# Patient Record
Sex: Female | Born: 1970 | Race: White | Hispanic: No | Marital: Married | Smoking: Never smoker
Health system: Southern US, Community
[De-identification: ages and names within clinical notes are randomized; demographics above are authoritative.]

## PROBLEM LIST (undated history)

## (undated) DIAGNOSIS — Z800 Family history of malignant neoplasm of digestive organs: Secondary | ICD-10-CM

## (undated) DIAGNOSIS — T7840XA Allergy, unspecified, initial encounter: Secondary | ICD-10-CM

## (undated) DIAGNOSIS — K635 Polyp of colon: Secondary | ICD-10-CM

## (undated) DIAGNOSIS — J45909 Unspecified asthma, uncomplicated: Secondary | ICD-10-CM

## (undated) DIAGNOSIS — Z803 Family history of malignant neoplasm of breast: Secondary | ICD-10-CM

## (undated) HISTORY — DX: Family history of malignant neoplasm of breast: Z80.3

## (undated) HISTORY — PX: WISDOM TOOTH EXTRACTION: SHX21

## (undated) HISTORY — DX: Unspecified asthma, uncomplicated: J45.909

## (undated) HISTORY — DX: Allergy, unspecified, initial encounter: T78.40XA

## (undated) HISTORY — DX: Polyp of colon: K63.5

## (undated) HISTORY — DX: Family history of malignant neoplasm of digestive organs: Z80.0

---

## 1998-09-27 HISTORY — PX: WISDOM TOOTH EXTRACTION: SHX21

## 2001-04-17 ENCOUNTER — Encounter: Admission: RE | Admit: 2001-04-17 | Discharge: 2001-04-26 | Payer: Self-pay | Admitting: Orthopedic Surgery

## 2001-04-27 ENCOUNTER — Encounter: Admission: RE | Admit: 2001-04-27 | Discharge: 2001-07-26 | Payer: Self-pay | Admitting: Orthopedic Surgery

## 2004-01-31 ENCOUNTER — Other Ambulatory Visit: Admission: RE | Admit: 2004-01-31 | Discharge: 2004-01-31 | Payer: Self-pay | Admitting: *Deleted

## 2004-01-31 ENCOUNTER — Other Ambulatory Visit: Admission: RE | Admit: 2004-01-31 | Discharge: 2004-01-31 | Payer: Self-pay | Admitting: Obstetrics and Gynecology

## 2006-07-01 ENCOUNTER — Inpatient Hospital Stay (HOSPITAL_COMMUNITY): Admission: AD | Admit: 2006-07-01 | Discharge: 2006-07-04 | Payer: Self-pay | Admitting: Obstetrics and Gynecology

## 2008-02-22 ENCOUNTER — Ambulatory Visit: Payer: Self-pay | Admitting: Emergency Medicine

## 2008-02-22 DIAGNOSIS — J45909 Unspecified asthma, uncomplicated: Secondary | ICD-10-CM | POA: Insufficient documentation

## 2008-02-22 DIAGNOSIS — R0602 Shortness of breath: Secondary | ICD-10-CM | POA: Insufficient documentation

## 2008-05-08 ENCOUNTER — Inpatient Hospital Stay (HOSPITAL_COMMUNITY): Admission: AD | Admit: 2008-05-08 | Discharge: 2008-05-10 | Payer: Self-pay | Admitting: Obstetrics and Gynecology

## 2010-12-28 ENCOUNTER — Other Ambulatory Visit (HOSPITAL_COMMUNITY): Payer: Self-pay | Admitting: Obstetrics and Gynecology

## 2010-12-28 DIAGNOSIS — Z1231 Encounter for screening mammogram for malignant neoplasm of breast: Secondary | ICD-10-CM

## 2010-12-29 ENCOUNTER — Ambulatory Visit (HOSPITAL_COMMUNITY)
Admission: RE | Admit: 2010-12-29 | Discharge: 2010-12-29 | Disposition: A | Discharge status: Home / Self Care | Payer: BC Managed Care – PPO | Source: Ambulatory Visit | Attending: Obstetrics and Gynecology | Admitting: Obstetrics and Gynecology

## 2010-12-29 DIAGNOSIS — Z1231 Encounter for screening mammogram for malignant neoplasm of breast: Secondary | ICD-10-CM

## 2011-02-12 NOTE — Discharge Summary (Signed)
Tanya Vaughn, Tanya Vaughn NO.:  [id number]   MEDICAL RECORD NO.:  [id number]          PATIENT TYPE:  INP   LOCATION:  9101                          FACILITY:  [facility]   PHYSICIAN:  Maxie Better, M.D.DATE OF BIRTH:  1971/08/14   DATE OF ADMISSION:  07/01/2006  DATE OF DISCHARGE:  07/04/2006                               DISCHARGE SUMMARY   ADMISSION DIAGNOSES:  1. Term gestation.  2. Early labor.   DISCHARGE DIAGNOSES:  1. Term gestation, delivered.  2. Fetal macrosomia.  3. Shoulder dystocia.  4. Vacuum extraction.  5. Fourth-degree laceration.  6. Right vaginal sulcus laceration, repaired.   HISTORY OF PRESENT ILLNESS:  A 40 year old gravida 1, para 0 female at  39-6/7 weeks admitted in early labor. Group B Strep culture was  negative. Her prenatal course had been unremarkable.   HOSPITAL COURSE:  The patient was admitted to [facility] in early  labor. The patient subsequently had Pitocin augmentation of her labor.  She subsequently received epidural anesthesia for pain management. The  patient had spontaneous rupture of membranes on admission wherein she  was 1, 90 and -1 on initial presentation. Group B Strep was negative.  Pitocin was continued and the patient progressed to full dilatation,  pushed for 3 hours. The patient was offered a vacuum extraction due to  maternal exhaustion. Mushroom vacuum was subsequently applied after  patient agreed and risks and benefits had been reviewed. Subsequent  delivery of a live female who was 9 pounds 14 ounces from a direct occiput  anterior position was accomplished. Shoulder dystocia was noted which  was released with McRoberts and suprapubic pressure. Apgars of 9 and 9  were assigned. The placenta which was large had a question of a  succenturiate lobe and no cervical laceration was noted. A vaginal  laceration and a 4th-degree extension of her midline episiotomy was  noted and repaired. The patient's  postpartum course subsequently was  unremarkable. Her CBC on postop day #1 showed a hemoglobin of 9.7,  hematocrit 27.9, white count 14.1, platelet count of 215,000. The  patient remained, was continent of flatus. She was deemed well to be  discharged home.   DISPOSITION:  Home.   CONDITION ON DISCHARGE:  Stable.   DISCHARGE MEDICATIONS:  1. Motrin 800 mg every 8 hours p.r.n. pain.  2. Darvocet N-100 1 every 4-6 hours p.r.n. pain.  3. Stool softener 100 mg p.o. 1-3x per day.  4. Benefiber 1-2 tablets daily.  5. Prenatal vitamins 1 p.o. daily.  6. Niferex Forte 150 mg daily.   FOLLOWUP:  Appointment at [facility] in 2 weeks and at 6 weeks  postpartum.   DISCHARGE INSTRUCTIONS:  Per the postpartum booklet.  In addition the patient was advised not to strain with bowel movement.  Sitz baths, tub soaks twice daily.  No intercourse until cleared per the instructions.      Maxie Better, M.D.  Electronically Signed     [place]/MEDQ  D:  08/21/2006  T:  08/21/2006  Job:  [id number]

## 2011-06-25 LAB — CBC
HCT: 40.3
MCHC: 33.7
MCHC: 33.8
MCV: 93.5
MCV: 93.9
Platelets: 219
Platelets: 285
RBC: 3.28 — ABNORMAL LOW
RDW: 13.5
RDW: 13.8

## 2011-12-22 ENCOUNTER — Other Ambulatory Visit (HOSPITAL_COMMUNITY): Payer: Self-pay | Admitting: Obstetrics and Gynecology

## 2011-12-22 DIAGNOSIS — Z1231 Encounter for screening mammogram for malignant neoplasm of breast: Secondary | ICD-10-CM

## 2011-12-29 ENCOUNTER — Ambulatory Visit (INDEPENDENT_AMBULATORY_CARE_PROVIDER_SITE_OTHER): Payer: Managed Care, Other (non HMO) | Admitting: Sports Medicine

## 2011-12-29 VITALS — BP 100/62 | Ht 66.0 in | Wt 145.0 lb

## 2011-12-29 DIAGNOSIS — Q667 Congenital pes cavus, unspecified foot: Secondary | ICD-10-CM | POA: Insufficient documentation

## 2011-12-29 DIAGNOSIS — M722 Plantar fascial fibromatosis: Secondary | ICD-10-CM

## 2011-12-29 DIAGNOSIS — M216X9 Other acquired deformities of unspecified foot: Secondary | ICD-10-CM

## 2011-12-29 NOTE — Progress Notes (Signed)
  Subjective:    Patient ID: Tanya Vaughn, female    DOB: 13-Jul-1971, 41 y.o.   MRN: [id number]  HPI  Pt presents to clinic for evaluation of rt heel pain that started about 7 months ago after running on treadmill for 5 miles.  Tender in plantar aspect of rt heel.  Unable to run or walk without pain now. Exercises on elliptical now, which is not as painful.  Has pain walking barefoot.  She has tried some ibuprofen and that has not helped. She has used some ice application without much relief. No other treatments. She was referred by some friends who have been to see me. .   Review of Systems     Objective:   Physical Exam Pleasant athletic-looking female  Pain on medial insertion of plantar fascia on rt Feels discomfort with heel squeeze but not exquisitely painful on rt Cavus foot bilat Both great toes have good motion Splaying of toes 1-2 on rt  pezogenic papules on bilat heels No calcaneal valgus  Leg lengths equal, good alignment Good abduction strength bilat Hip flexion strong bilat  Musculoskeletal ultrasound Right plantar fascia is 0.61 cm thick with some slight hypoechoic change The right Achilles tendon by comparison is 0.4 thick and appears normal Left plantar fascia is 0.45 thick and has a very small spur      Assessment & Plan:

## 2011-12-29 NOTE — Assessment & Plan Note (Signed)
Documented by ultrasound and exam  use arch strep Stretches Exercises Ice bath Sports insoles with scaphoid pads added  Restart her walking program at a moderate level but she needs to avoid limping Recheck 6 weeks

## 2011-12-29 NOTE — Patient Instructions (Signed)
Please use arch strap and green sports insoles when you are doing being active  Ice right foot daily 1-2 times- soaking foot in ice water for total of 5-10 minutes is effective   Do suggested exercises daily  Please follow up in 6 weeks  Thank you for seeing [place] today!

## 2012-01-18 ENCOUNTER — Ambulatory Visit (HOSPITAL_COMMUNITY)
Admission: RE | Admit: 2012-01-18 | Discharge: 2012-01-18 | Disposition: A | Discharge status: Home / Self Care | Payer: Managed Care, Other (non HMO) | Source: Ambulatory Visit | Attending: Obstetrics and Gynecology | Admitting: Obstetrics and Gynecology

## 2012-01-18 DIAGNOSIS — Z1231 Encounter for screening mammogram for malignant neoplasm of breast: Secondary | ICD-10-CM | POA: Insufficient documentation

## 2012-02-09 ENCOUNTER — Ambulatory Visit: Payer: Managed Care, Other (non HMO) | Admitting: Sports Medicine

## 2012-03-06 ENCOUNTER — Ambulatory Visit: Payer: Managed Care, Other (non HMO) | Admitting: Sports Medicine

## 2012-03-17 ENCOUNTER — Other Ambulatory Visit: Payer: Self-pay | Admitting: Obstetrics and Gynecology

## 2012-03-17 DIAGNOSIS — Z803 Family history of malignant neoplasm of breast: Secondary | ICD-10-CM

## 2012-03-28 ENCOUNTER — Ambulatory Visit
Admission: RE | Admit: 2012-03-28 | Discharge: 2012-03-28 | Disposition: A | Discharge status: Home / Self Care | Payer: Managed Care, Other (non HMO) | Source: Ambulatory Visit | Attending: Obstetrics and Gynecology | Admitting: Obstetrics and Gynecology

## 2012-03-28 DIAGNOSIS — Z803 Family history of malignant neoplasm of breast: Secondary | ICD-10-CM

## 2012-03-28 MED ORDER — GADOBENATE DIMEGLUMINE 529 MG/ML IV SOLN
13.0000 mL | Freq: Once | INTRAVENOUS | Status: AC | PRN
  Administered 2012-03-28: 13 mL via INTRAVENOUS

## 2013-02-20 ENCOUNTER — Other Ambulatory Visit (HOSPITAL_COMMUNITY): Payer: Self-pay | Admitting: Obstetrics and Gynecology

## 2013-02-20 DIAGNOSIS — Z1231 Encounter for screening mammogram for malignant neoplasm of breast: Secondary | ICD-10-CM

## 2013-04-02 ENCOUNTER — Ambulatory Visit (HOSPITAL_COMMUNITY): Payer: Managed Care, Other (non HMO)

## 2013-04-03 ENCOUNTER — Ambulatory Visit (HOSPITAL_COMMUNITY)
Admission: RE | Admit: 2013-04-03 | Discharge: 2013-04-03 | Disposition: A | Discharge status: Home / Self Care | Payer: Medicare PPO | Source: Ambulatory Visit | Attending: Obstetrics and Gynecology | Admitting: Obstetrics and Gynecology

## 2013-04-03 DIAGNOSIS — Z1231 Encounter for screening mammogram for malignant neoplasm of breast: Secondary | ICD-10-CM

## 2013-04-05 ENCOUNTER — Other Ambulatory Visit: Payer: Self-pay | Admitting: Obstetrics and Gynecology

## 2013-04-05 DIAGNOSIS — R928 Other abnormal and inconclusive findings on diagnostic imaging of breast: Secondary | ICD-10-CM

## 2013-04-18 ENCOUNTER — Ambulatory Visit
Admission: RE | Admit: 2013-04-18 | Discharge: 2013-04-18 | Disposition: A | Discharge status: Home / Self Care | Payer: Medicare PPO | Source: Ambulatory Visit | Attending: Obstetrics and Gynecology | Admitting: Obstetrics and Gynecology

## 2013-04-18 DIAGNOSIS — R928 Other abnormal and inconclusive findings on diagnostic imaging of breast: Secondary | ICD-10-CM

## 2014-04-15 ENCOUNTER — Other Ambulatory Visit (HOSPITAL_COMMUNITY): Payer: Self-pay | Admitting: Obstetrics and Gynecology

## 2014-04-15 DIAGNOSIS — Z1231 Encounter for screening mammogram for malignant neoplasm of breast: Secondary | ICD-10-CM

## 2014-04-19 ENCOUNTER — Ambulatory Visit (HOSPITAL_COMMUNITY)
Admission: RE | Admit: 2014-04-19 | Discharge: 2014-04-19 | Disposition: A | Discharge status: Home / Self Care | Payer: Managed Care, Other (non HMO) | Source: Ambulatory Visit | Attending: Obstetrics and Gynecology | Admitting: Obstetrics and Gynecology

## 2014-04-19 DIAGNOSIS — Z1231 Encounter for screening mammogram for malignant neoplasm of breast: Secondary | ICD-10-CM

## 2014-07-08 IMAGING — US US BREAST*R*
1 series · 5 of 5 positions shown · non-contrast
Comparison: 04/03/2013

CLINICAL DATA: Possible mass right breast identified on the single
view of the recent screening mammogram.

DIGITAL DIAGNOSTIC RIGHT MAMMOGRAM WITH CAD AND RIGHT BREAST
ULTRASOUND:

[Series 1: us breast*right* · 5 of 5 slices shown]
[im 1/5]
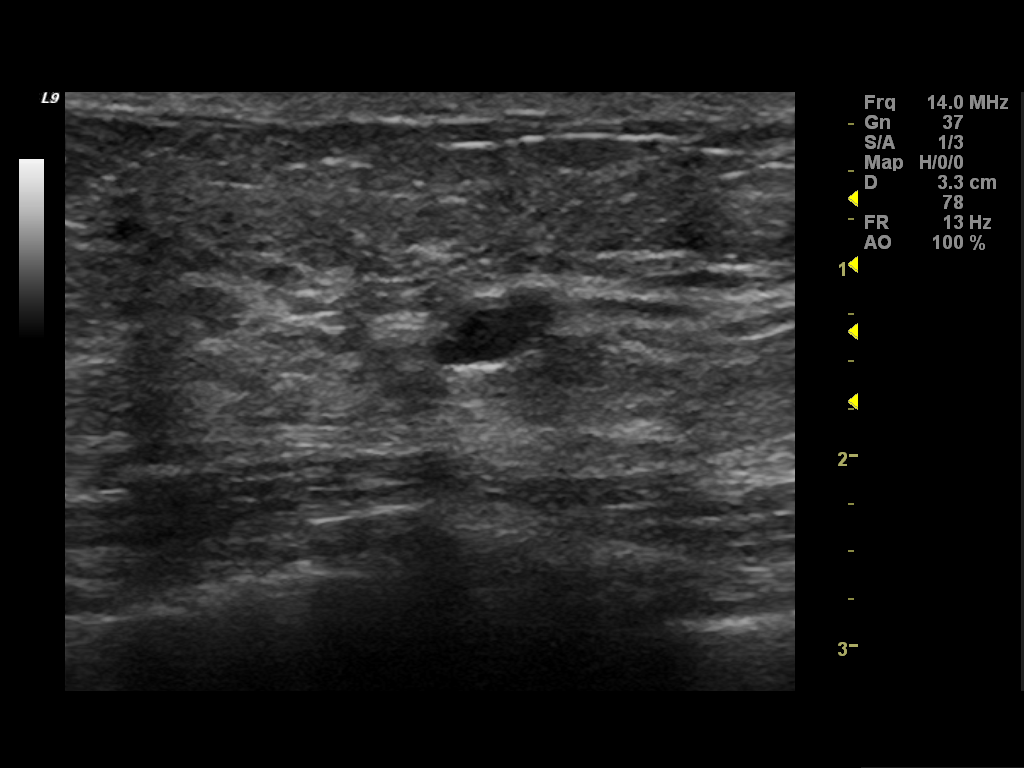
[im 2/5]
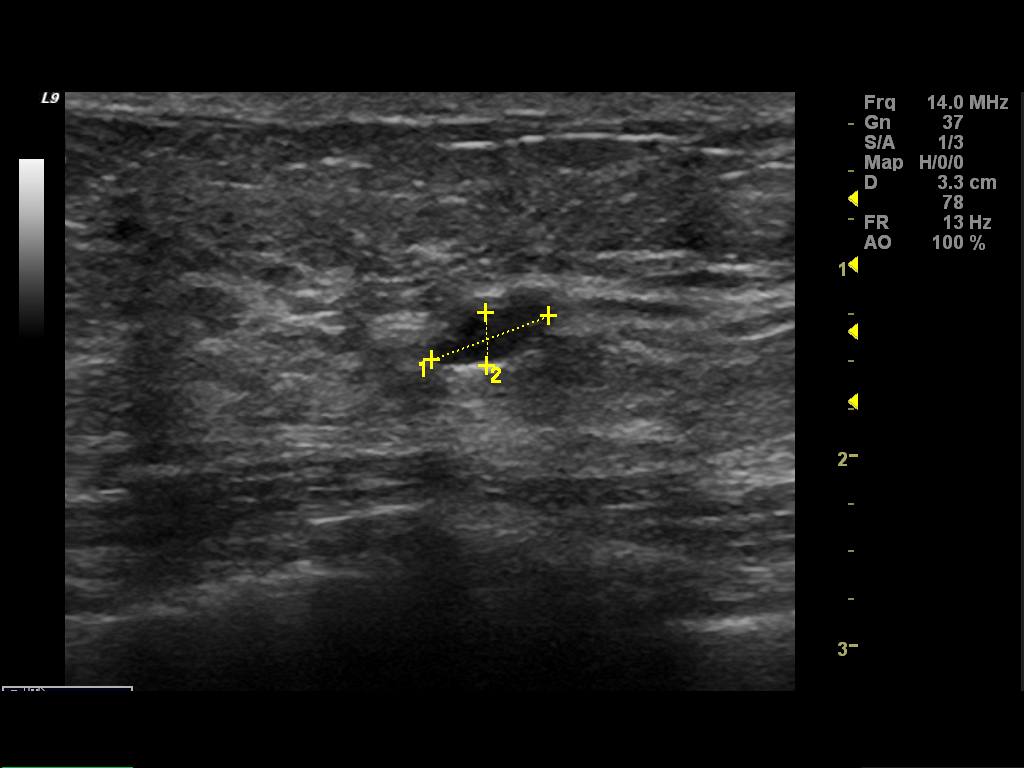
[im 3/5]
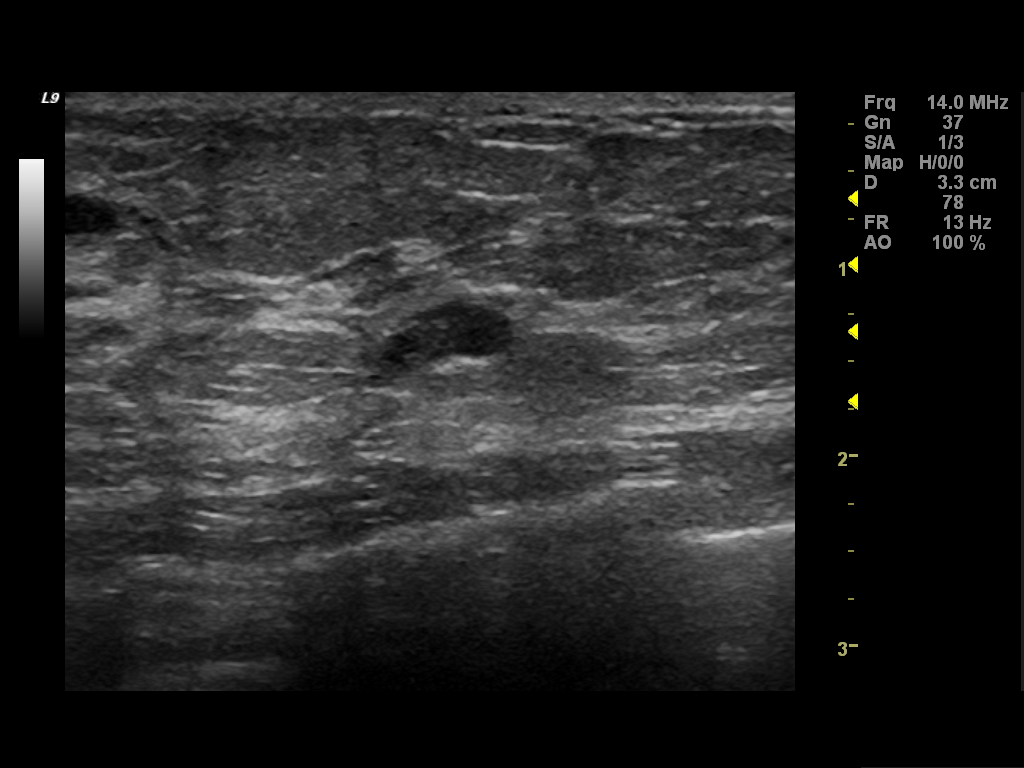
[im 4/5]
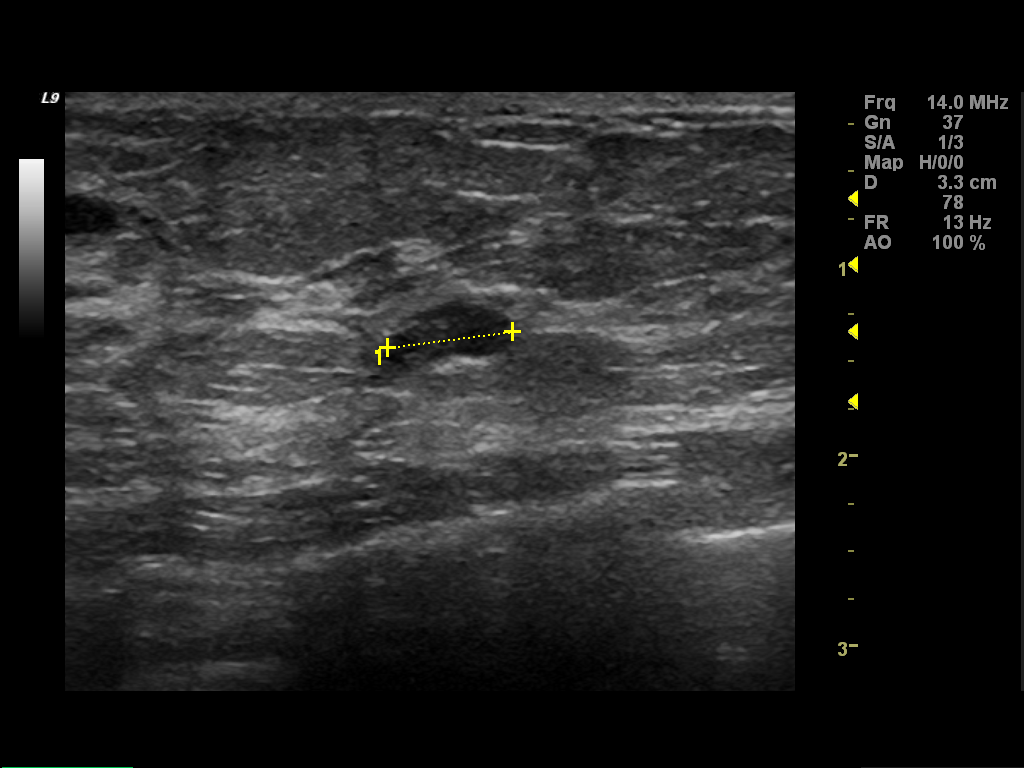
[im 5/5]
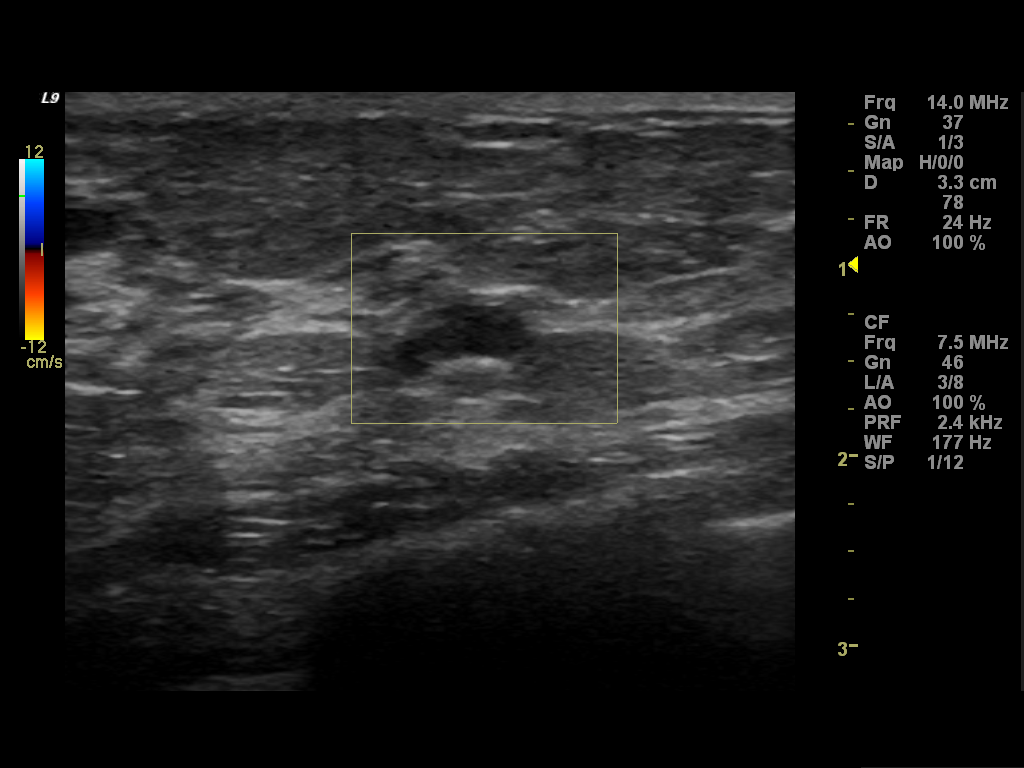

[5 of 5 positions shown; findings below may reference images not displayed]

FINDINGS: ACR Breast Density Category c:  The breast tissue is
heterogeneously dense, which may obscure small masses.

A focal spot compression view of the medial right breast shows
heterogeneously dense breast parenchyma.  The area of concern on
recent screening mammogram is less prominent on focal spot
compression view, suggesting that it is an island of glandular
tissue.  On the 90 degrees lateral view of the right breast, no
suspicious mass is identified.

Mammographic images were processed with CAD.

On physical exam, no mass is palpated in the medial right breast.

Ultrasound is performed, showing a cyst with some internal echoes
subareolar right breast 3 o'clock position measuring 7 x 3 x 7 mm.
This is closer to the nipple than the mammographic area of concern
on the screening mammogram.  The remainder of the medial right
breast is negative.
IMPRESSION: No evidence of malignancy in the right breast.  Incidentally noted
is a minimally complex cyst in the subareolar right breast.  The
area of concern on recent screening mammogram is most consistent
with an island of fibroglandular breast tissue.

RECOMMENDATION:
Bilateral screening mammogram in 1 year.

I have discussed the findings and recommendations with the patient.
Results were also provided in writing at the conclusion of the
visit.  If applicable, a reminder letter will be sent to the
patient regarding the next appointment.

BI-RADS CATEGORY 2:  Benign finding(s).

## 2014-07-08 IMAGING — MG MM DIGITAL DIAGNOSTIC LIMITED*R*
2 series · 2 of 2 positions shown · non-contrast
Comparison: 04/03/2013

CLINICAL DATA: Possible mass right breast identified on the single
view of the recent screening mammogram.

DIGITAL DIAGNOSTIC RIGHT MAMMOGRAM WITH CAD AND RIGHT BREAST
ULTRASOUND:

[R ML]
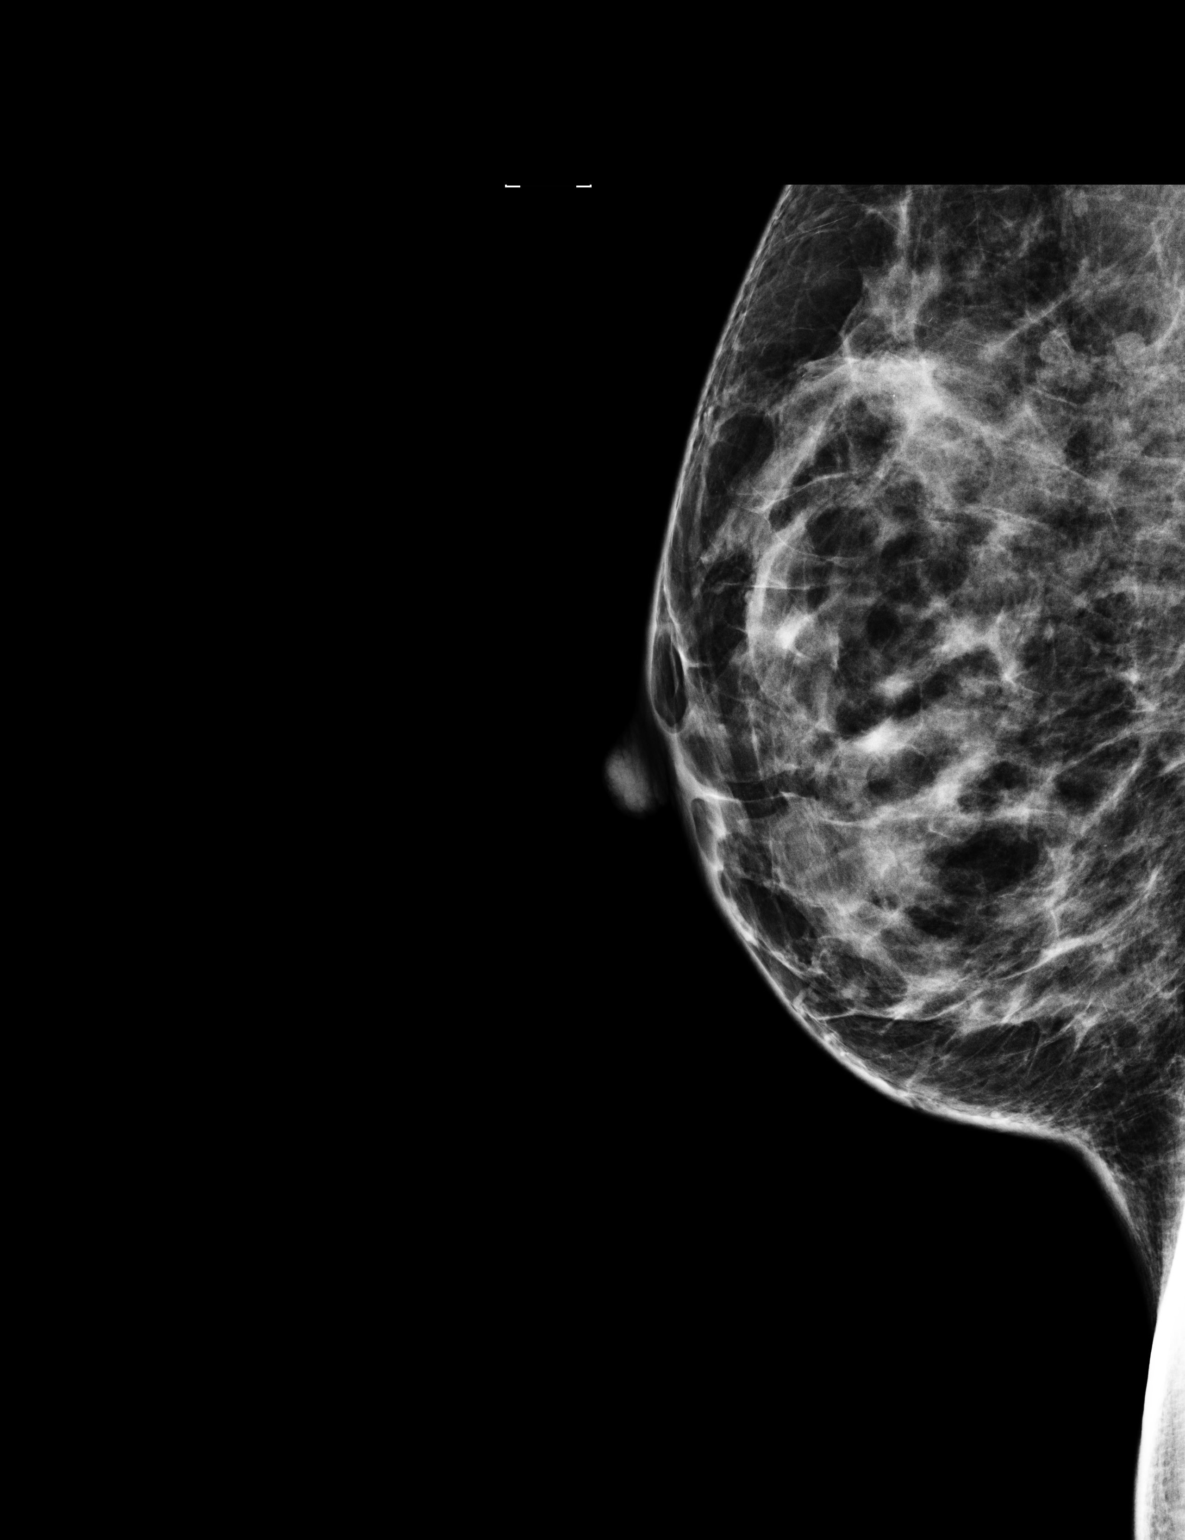

[R CC]
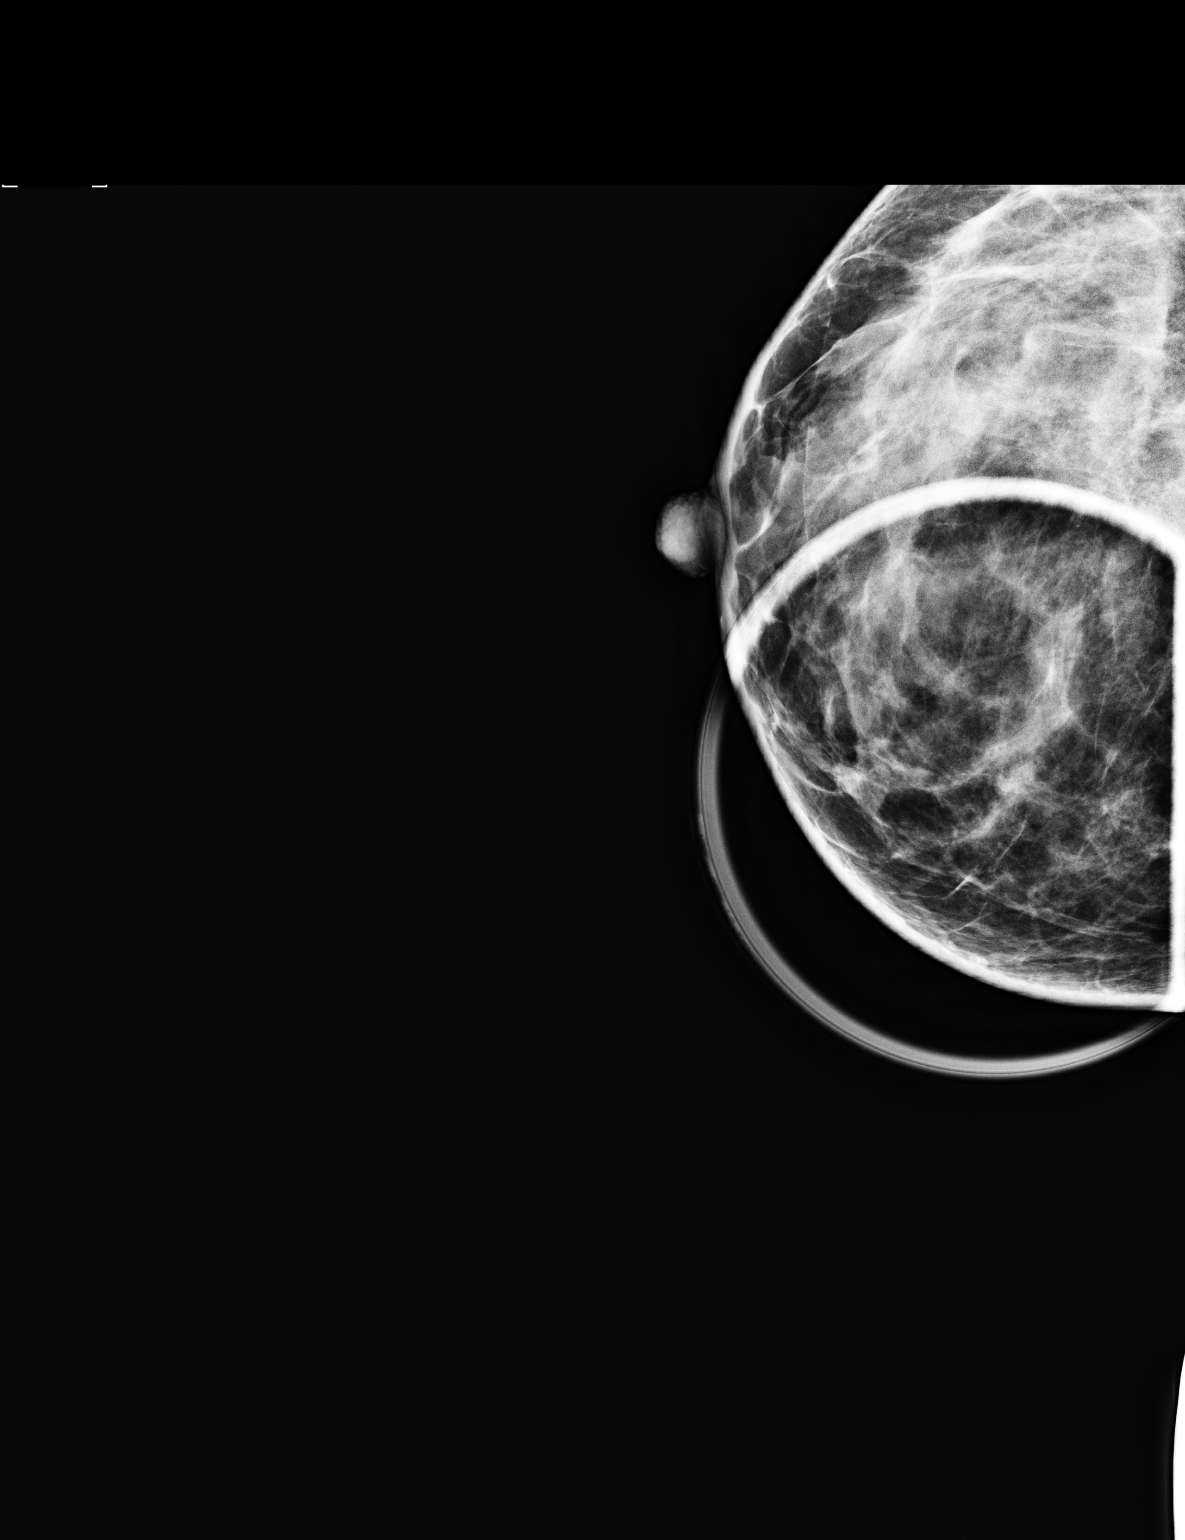

[2 of 2 positions shown; findings below may reference images not displayed]

FINDINGS: ACR Breast Density Category c:  The breast tissue is
heterogeneously dense, which may obscure small masses.

A focal spot compression view of the medial right breast shows
heterogeneously dense breast parenchyma.  The area of concern on
recent screening mammogram is less prominent on focal spot
compression view, suggesting that it is an island of glandular
tissue.  On the 90 degrees lateral view of the right breast, no
suspicious mass is identified.

Mammographic images were processed with CAD.

On physical exam, no mass is palpated in the medial right breast.

Ultrasound is performed, showing a cyst with some internal echoes
subareolar right breast 3 o'clock position measuring 7 x 3 x 7 mm.
This is closer to the nipple than the mammographic area of concern
on the screening mammogram.  The remainder of the medial right
breast is negative.
IMPRESSION: No evidence of malignancy in the right breast.  Incidentally noted
is a minimally complex cyst in the subareolar right breast.  The
area of concern on recent screening mammogram is most consistent
with an island of fibroglandular breast tissue.

RECOMMENDATION:
Bilateral screening mammogram in 1 year.

I have discussed the findings and recommendations with the patient.
Results were also provided in writing at the conclusion of the
visit.  If applicable, a reminder letter will be sent to the
patient regarding the next appointment.

BI-RADS CATEGORY 2:  Benign finding(s).

## 2015-05-05 ENCOUNTER — Ambulatory Visit: Payer: Medicare PPO | Admitting: Sports Medicine

## 2015-12-03 ENCOUNTER — Encounter: Payer: Self-pay | Admitting: Pulmonary Disease

## 2015-12-03 ENCOUNTER — Ambulatory Visit (INDEPENDENT_AMBULATORY_CARE_PROVIDER_SITE_OTHER): Payer: 59 | Admitting: Pulmonary Disease

## 2015-12-03 VITALS — BP 108/72 | HR 74 | Ht 66.0 in | Wt 147.8 lb

## 2015-12-03 DIAGNOSIS — J45909 Unspecified asthma, uncomplicated: Secondary | ICD-10-CM | POA: Diagnosis not present

## 2015-12-03 LAB — NITRIC OXIDE: Nitric Oxide: 232

## 2015-12-03 MED ORDER — FLUTICASONE FUROATE-VILANTEROL 100-25 MCG/INH IN AEPB: 1.0000 | INHALATION_SPRAY | Freq: Every day | RESPIRATORY_TRACT | Status: DC

## 2015-12-03 MED ORDER — PREDNISONE 10 MG PO TABS: ORAL_TABLET | ORAL | Status: DC

## 2015-12-03 NOTE — Progress Notes (Signed)
Past medical history She  has a past medical history of Asthma.  Past surgical history She  has past surgical history that includes Wisdom tooth extraction.  Family history Her family history includes Breast cancer in her mother; Emphysema in her maternal grandmother.  Social history She  reports that she has never smoked. She does not have any smokeless tobacco history on file. She reports that she drinks alcohol. She reports that she does not use illicit drugs.  No Known Allergies  No current outpatient prescriptions on file prior to visit.   No current facility-administered medications on file prior to visit.    Review of Systems  Constitutional: Negative for fever and unexpected weight change.  HENT: Negative for congestion, dental problem, ear pain, nosebleeds, postnasal drip, rhinorrhea, sinus pressure, sneezing, sore throat and trouble swallowing.   Eyes: Negative for redness and itching.  Respiratory: Positive for cough and shortness of breath. Negative for chest tightness and wheezing.   Cardiovascular: Negative for palpitations and leg swelling.  Gastrointestinal: Negative for nausea and vomiting.  Genitourinary: Negative for dysuria.  Musculoskeletal: Negative for joint swelling.  Skin: Negative for rash.  Neurological: Negative for headaches.  Hematological: Does not bruise/bleed easily.  Psychiatric/Behavioral: Negative for dysphoric mood. The patient is not nervous/anxious.     Chief Complaint  Patient presents with  . SLEEP CONSULT    Self referral for exercise induced asthma.    Vital signs BP 108/72 mmHg  Pulse 74  Ht 5\' 6"  (1.676 m)  Wt 147 lb 12.8 oz (67.042 kg)  BMI 23.87 kg/m2  SpO2 100%  History of present illness Tanya Vaughn is a 45 y.o. female for evaluation of asthma.  She has been under therapy for asthma for years.  She used to be treated with inhaled steroids when she was younger, but her symptoms improved as she became an adult.   She is very active, and exercises on a regular basis.  Her symptoms would typically occur with exercise, and this would be easily controlled with montelukast and proair as needed.  She has been followed by Dr. Orvil Feil with [place] allergy.  She was told she has allergy to dust mite, mold, and pets.  She developed a bronchitis in January.  She did not have the flu.  She was given a course of prednisone and felt better.  She was back to baseline until about 3 weeks ago.  Over the past 3 weeks it has become more difficult for her to do activities.  She is a point right now in which it is hard for her to even go for a walk >> she is normally an avid runner.  She feels tight in her chest, and feels like she can't get enough air in.  She is wheezing and has cough with clear sputum.  She denies fever, eye irritation, sinus congestion, pain in ears, sore throat, skin rash/hives, or gland swelling.  There is no history of pneumonia or recent sick exposures.  She does not smoke cigarettes.  She has not travelled recently.  She denies reaction to aspirin or NSAIDs, and no other allergies to foods/medicines.  She denies allergic reactions to insect stings.   Physical exam  General - No distress ENT - No sinus tenderness, no oral exudate, no LAN, no thyromegaly, TM clear, pupils equal/reactive Cardiac - s1s2 regular, no murmur, pulses symmetric Chest - b/l expiratory wheeze, coarse rhonchi b/l that clear partially with cough Back - No focal tenderness Abd - Soft,  non-tender, no organomegaly, + bowel sounds Ext - No edema Neuro - Normal strength, cranial nerves intact Skin - No rashes Psych - Normal mood, and behavior  Nitric oxide test 12/03/15 >> 232   Discussion She has a history of exercise induced asthma.  She also has history of allergies to dust mites and pets.  She was maintained on montelukast and prn albuterol.  She developed a respiratory infection in January and developed asthmatic bronchitis  after that.  Her symptoms have been progressively worse since then.  She has significant elevation in nitric oxide and wheezing on exam today.   Assessment/plan  Acute asthmatic bronchitis. Plan: - will give course of prednisone - will add breo one puff daily - continue montelukast qhs and prn proair - repeat nitric oxide testing at follow up  - if not improved at follow up, then consider pulmonary function test and chest xray  Allergies to dust mites. Plan: - she is followed by Dr. Orvil Feil with allergy - discussed environmental control techniques    Patient Instructions  Prednisone 10 mg pills >> 3 pills daily for 2 days, 2 pills daily for 2 days, 1 pill daily for 2 days  Breo one puff daily >> rinse mouth after each use  Continue montelukast 10 mg nightly  Follow up in 3 weeks with Dr. Halford Chessman or Nurse practitioner       Chesley Mires, MD [place] Pager:  [phone]

## 2015-12-03 NOTE — Progress Notes (Signed)
   Subjective:    Patient ID: Tanya Vaughn, female    DOB: [birth year], 45 y.o.   MRN: [id number]  HPI    Review of Systems  Constitutional: Negative for fever and unexpected weight change.  HENT: Negative for congestion, dental problem, ear pain, nosebleeds, postnasal drip, rhinorrhea, sinus pressure, sneezing, sore throat and trouble swallowing.   Eyes: Negative for redness and itching.  Respiratory: Positive for cough and shortness of breath. Negative for chest tightness and wheezing.   Cardiovascular: Negative for palpitations and leg swelling.  Gastrointestinal: Negative for nausea and vomiting.  Genitourinary: Negative for dysuria.  Musculoskeletal: Negative for joint swelling.  Skin: Negative for rash.  Neurological: Negative for headaches.  Hematological: Does not bruise/bleed easily.  Psychiatric/Behavioral: Negative for dysphoric mood. The patient is not nervous/anxious.        Objective:   Physical Exam        Assessment & Plan:

## 2015-12-03 NOTE — Patient Instructions (Signed)
Prednisone 10 mg pills >> 3 pills daily for 2 days, 2 pills daily for 2 days, 1 pill daily for 2 days  Breo one puff daily >> rinse mouth after each use  Continue montelukast 10 mg nightly  Follow up in 3 weeks with Dr. Halford Chessman or Nurse practitioner

## 2015-12-24 ENCOUNTER — Ambulatory Visit: Payer: 59 | Admitting: Adult Health

## 2015-12-30 ENCOUNTER — Ambulatory Visit (INDEPENDENT_AMBULATORY_CARE_PROVIDER_SITE_OTHER): Payer: 59 | Admitting: Adult Health

## 2015-12-30 ENCOUNTER — Encounter: Payer: Self-pay | Admitting: Adult Health

## 2015-12-30 VITALS — BP 108/68 | HR 62 | Temp 97.7°F | Ht 66.0 in | Wt 149.0 lb

## 2015-12-30 DIAGNOSIS — R0602 Shortness of breath: Secondary | ICD-10-CM

## 2015-12-30 DIAGNOSIS — J4530 Mild persistent asthma, uncomplicated: Secondary | ICD-10-CM

## 2015-12-30 LAB — NITRIC OXIDE: Nitric Oxide: 39

## 2015-12-30 NOTE — Progress Notes (Signed)
Subjective:    Patient ID: Tanya Vaughn, female    DOB: [birth year], 45 y.o.   MRN: [id number]  HPI 45 yo female never smoker with Asthma -exercise induced.   12/30/2015 Follow up : Asthma  Pt returns for follow up for asthma .  Seen 1 month ago  For pulmonary consult for slow to resolve asthma flare , given prednisone taper  And started on BREO .  She is feeling much better w/ resolved cough and wheezing  Rare SABA use.  Has returned to baseline.  She is a runner, has upcoming 10K in couple of weeks  Has been training , ran 6 miles this weekend with no trouble.  Exhaled Nitric oxide test 3/8 was 232 .  She remains on Singulair .  Spirometry today shows normal lung function FEV1 99, ratio 75 , FVC 108%.  Exhaled Nitro oxide shows today is much better at 28   Past Medical History  Diagnosis Date  . Asthma    Current Outpatient Prescriptions on File Prior to Visit  Medication Sig Dispense Refill  . fluticasone furoate-vilanterol (BREO ELLIPTA) 100-25 MCG/INH AEPB Inhale 1 puff into the lungs daily. 1 each 5  . montelukast (SINGULAIR) 10 MG tablet Take 10 mg by mouth every evening.  5  . PROAIR RESPICLICK 123XX123 (90 Base) MCG/ACT AEPB Inhale 2 puffs into the lungs as needed.  0   No current facility-administered medications on file prior to visit.      Review of Systems Constitutional:   No  weight loss, night sweats,  Fevers, chills, fatigue, or  lassitude.  HEENT:   No headaches,  Difficulty swallowing,  Tooth/dental problems, or  Sore throat,                No sneezing, itching, ear ache, nasal congestion, post nasal drip,   CV:  No chest pain,  Orthopnea, PND, swelling in lower extremities, anasarca, dizziness, palpitations, syncope.   GI  No heartburn, indigestion, abdominal pain, nausea, vomiting, diarrhea, change in bowel habits, loss of appetite, bloody stools.   Resp: No shortness of breath with exertion or at rest.  No excess mucus, no productive cough,  No  non-productive cough,  No coughing up of blood.  No change in color of mucus.  No wheezing.  No chest wall deformity  Skin: no rash or lesions.  GU: no dysuria, change in color of urine, no urgency or frequency.  No flank pain, no hematuria   MS:  No joint pain or swelling.  No decreased range of motion.  No back pain.  Psych:  No change in mood or affect. No depression or anxiety.  No memory loss.         Objective:   Physical Exam Filed Vitals:   12/30/15 0953  BP: 108/68  Pulse: 62  Temp: 97.7 F (36.5 C)  TempSrc: Oral  Height: 5\' 6"  (1.676 m)  Weight: 149 lb (67.586 kg)  SpO2: 100%   GEN: A/Ox3; pleasant , NAD, well nourished   HEENT:  [place]/AT,  EACs-clear, TMs-wnl, NOSE-clear, THROAT-clear, no lesions, no postnasal drip or exudate noted.   NECK:  Supple w/ fair ROM; no JVD; normal carotid impulses w/o bruits; no thyromegaly or nodules palpated; no lymphadenopathy.  RESP  Clear  P & A; w/o, wheezes/ rales/ or rhonchi.no accessory muscle use, no dullness to percussion  CARD:  RRR, no m/r/g  , no peripheral edema, pulses intact, no cyanosis or clubbing.  GI:  Soft & nt; nml bowel sounds; no organomegaly or masses detected.  Musco: Warm bil, no deformities or joint swelling noted.   Neuro: alert, no focal deficits noted.    Skin: Warm, no lesions or rashes  Casara Perrier NP-C  [facility]  12/30/2015        Assessment & Plan:

## 2015-12-30 NOTE — Assessment & Plan Note (Addendum)
Improved control on BREO  FENO is much improved.  Spirometry is normal .   Plan  Continue on BREO daily , rinse well after use.  Use Albuterol .As needed   Good luck with your race.  Follow up with Dr. Halford Chessman in 3-4 months and As needed   Please contact office for sooner follow up if symptoms do not improve or worsen or seek emergency care

## 2015-12-30 NOTE — Patient Instructions (Addendum)
Continue on BREO daily , rinse well after use.  Use Albuterol .As needed   Good luck with your race.  Follow up with Dr. Halford Chessman in 3-4 months and As needed   Please contact office for sooner follow up if symptoms do not improve or worsen or seek emergency care

## 2016-01-01 NOTE — Progress Notes (Signed)
Reviewed and agree with assessment/plan.  Chesley Mires, MD [facility] 01/01/2016, 2:30 PM Pager:  [phone]

## 2016-04-13 ENCOUNTER — Encounter: Payer: Self-pay | Admitting: Pulmonary Disease

## 2016-04-13 ENCOUNTER — Ambulatory Visit (INDEPENDENT_AMBULATORY_CARE_PROVIDER_SITE_OTHER): Payer: 59 | Admitting: Pulmonary Disease

## 2016-04-13 VITALS — BP 92/60 | HR 63 | Ht 66.0 in | Wt 146.0 lb

## 2016-04-13 DIAGNOSIS — J45990 Exercise induced bronchospasm: Secondary | ICD-10-CM

## 2016-04-13 DIAGNOSIS — J3089 Other allergic rhinitis: Secondary | ICD-10-CM

## 2016-04-13 DIAGNOSIS — J4530 Mild persistent asthma, uncomplicated: Secondary | ICD-10-CM

## 2016-04-13 DIAGNOSIS — B370 Candidal stomatitis: Secondary | ICD-10-CM | POA: Diagnosis not present

## 2016-04-13 MED ORDER — FLUTICASONE FUROATE 100 MCG/ACT IN AEPB: 1.0000 | INHALATION_SPRAY | Freq: Every day | RESPIRATORY_TRACT | Status: DC

## 2016-04-13 MED ORDER — NYSTATIN 100000 UNIT/ML MT SUSP: 500000.0000 [IU] | Freq: Four times a day (QID) | OROMUCOSAL | Status: DC

## 2016-04-13 NOTE — Patient Instructions (Signed)
Arnuity one puff daily >> rinse mouth after each use  Nystatin 5 ml swish and swallow four times daily for 5 days  Follow up in 1 year

## 2016-04-13 NOTE — Progress Notes (Signed)
Current Outpatient Prescriptions on File Prior to Visit  Medication Sig  . montelukast (SINGULAIR) 10 MG tablet Take 10 mg by mouth every evening.  Marland Kitchen PROAIR RESPICLICK 123XX123 (90 Base) MCG/ACT AEPB Inhale 2 puffs into the lungs as needed.   No current facility-administered medications on file prior to visit.     Chief Complaint  Patient presents with  . Follow-up    Pt states that her breathing has been doing well since last seen - no recent flares. Reports that the heat does have some effect on her breathing at times.      Tests FeNO 12/03/15 >> 232 Spirometry 12/30/15 >> FEV1 2.98 (99%), FEV1% 75 FeNO 12/30/15 >> 39  Past medical hx, Past surgical hx, Allergies, Family hx, Social hx all reviewed.  Vital Signs BP 92/60 mmHg  Pulse 63  Ht 5\' 6"  (1.676 m)  Wt 146 lb (66.225 kg)  BMI 23.58 kg/m2  SpO2 98%  History of Present Illness Tanya Vaughn is a 45 y.o. female with allergic asthma.  Her breathing has been doing much better.  She still notices trouble running in hot/humidity weather.  She is not having trouble at night any more.  No sinus congestion.  She has not consistently been rinsing her mouth after using breo.  Physical Exam  General - No distress ENT - No sinus tenderness, white exudate posterior pharynx, no LAN Cardiac - s1s2 regular, no murmur Chest - No wheeze/rales/dullness Back - No focal tenderness Abd - Soft, non-tender Ext - No edema Neuro - Normal strength Skin - No rashes Psych - normal mood, and behavior   Assessment/Plan  Allergic asthma. - will change from breo to arnuity >> she is to call if she notices a difference - continue singulair, and prn proair  Exercise induced asthma. - discussed warm up routine, and pretreatment with proair  Thrush. - nystatin  - advised her to rinse mouth after every use of ICS  Allergies to dust mites. - she is followed by Dr. Orvil Feil with allergy >> she has f/u in January 2018 - discussed  environmental control techniques   Patient Instructions  Arnuity one puff daily >> rinse mouth after each use  Nystatin 5 ml swish and swallow four times daily for 5 days  Follow up in 1 year     Chesley Mires, MD [place] [facility] Pager:  [phone] 04/13/2016, 9:28 AM

## 2016-06-01 ENCOUNTER — Other Ambulatory Visit: Payer: Self-pay | Admitting: Pulmonary Disease

## 2016-06-01 NOTE — Telephone Encounter (Signed)
Pt returning call.Tanya Vaughn ° °

## 2016-06-01 NOTE — Telephone Encounter (Signed)
LMTC x 1  

## 2016-06-01 NOTE — Telephone Encounter (Signed)
Patient Instructions  Arnuity one puff daily >> rinse mouth after each use  Nystatin 5 ml swish and swallow four times daily for 5 days  Follow up in 1 year   ------- Dr. Halford Chessman changed the [facility] to [place] at last OV.  Called to discuss with patient.  Left message for patient to return call.

## 2016-06-02 NOTE — Telephone Encounter (Signed)
Call transferred to triage, line was blank.  Will await call back

## 2016-06-02 NOTE — Telephone Encounter (Signed)
Pt returning call and she is wanting to switch back to the breo, please advise [phone].Hillery Hunter

## 2016-06-03 NOTE — Telephone Encounter (Signed)
Attempted to call pt. Line was busy. Will try back. 

## 2016-06-04 MED ORDER — FLUTICASONE FUROATE-VILANTEROL 100-25 MCG/INH IN AEPB: 1.0000 | INHALATION_SPRAY | Freq: Every day | RESPIRATORY_TRACT | 6 refills | Status: DC

## 2016-06-04 NOTE — Telephone Encounter (Signed)
Pt is wanting to switch back to the [employer] that the [place] did not work in controlling her symptoms and she feels like the [place] helped more.  Rx sent to [facility]. Nothing further needed.

## 2016-06-04 NOTE — Telephone Encounter (Signed)
See other 06/01/16 telephone note. This has been taken care of. Nothing further needed.

## 2016-06-04 NOTE — Telephone Encounter (Signed)
lmtcb x1 for pt. 

## 2016-11-09 ENCOUNTER — Ambulatory Visit (INDEPENDENT_AMBULATORY_CARE_PROVIDER_SITE_OTHER): Payer: Managed Care, Other (non HMO) | Admitting: Sports Medicine

## 2016-11-09 ENCOUNTER — Ambulatory Visit: Payer: Self-pay

## 2016-11-09 ENCOUNTER — Encounter: Payer: Self-pay | Admitting: Sports Medicine

## 2016-11-09 VITALS — BP 99/66 | HR 62 | Ht 66.0 in | Wt 148.0 lb

## 2016-11-09 DIAGNOSIS — Q667 Congenital pes cavus, unspecified foot: Secondary | ICD-10-CM

## 2016-11-09 DIAGNOSIS — M722 Plantar fascial fibromatosis: Secondary | ICD-10-CM | POA: Diagnosis not present

## 2016-11-09 DIAGNOSIS — M79672 Pain in left foot: Secondary | ICD-10-CM

## 2016-11-09 NOTE — Assessment & Plan Note (Signed)
Less cavus change noted today than in 2013 and may reflect loss of long arch

## 2016-11-09 NOTE — Progress Notes (Signed)
  Tanya Vaughn - 46 y.o. female MRN [id number]  Date of birth: [birth year]  SUBJECTIVE:   CC: L. Heel Pain  HPI: Patient states that 4 months ago she started having left heel pain. Localizes pain to calcaneus. Pain is worse when patient tries to get up from sitting or in the middle the night when she tries to go to the bathroom. She is unable to walk on her foot at that time. She states that she gets flares of pain after she runs. Has reduced running significantly. Only runs now 2 days a week 2-3 miles. She has tried Aleve and Advil with some relief. Denies any changes in running regimen. History of plantar fasciitis to right foot in 2013. States that this is not felt like her prior plantar fasciitis. Denies any injury to foot.   ROS  No LBP No Sciatica   HISTORY: Past Medical, Surgical, Social, and Family History Reviewed & Updated per EMR.    PHYSICAL EXAM:  BP 99/66   Pulse 62   Ht 5\' 6"  (1.676 m)   Wt 148 lb (67.1 kg)   BMI 23.89 kg/m   General: alert, well-developed, NAD, cooperative Pulses: DP/PT are full and equal bilaterally.  Extremities: No cyanosis, clubbing, edema Neurologic: No focal deficits, gait normal, A&Ox3. Deep tendon reflexes symmetrical and normal. Skin: Intact without suspicious lesions or rashes. Warm and dry. Psych: Mood and affect are normal; no evidence of anxiety or depression.  L. Foot Exam: no joint swelling, no joint warmth, and no redness over joints Normal ROM Strength intact at hip, quad, and hamstring Pulses palpable  Dominant 1st toe with splay between 1st and 2nd digits Normal leg lengths Normal alignment Pain on medial insertion of plantar fascia   Good abduction strength bilat  Running gait showed good form and mild forefoot supination  Limited MSK exam of left plantar surface of foot:  Left plantar fascia is 0.68 cm thick with some slight hypoechoic change. Transverse plane .66 cm. Arch strain appreciated on right plantar fascia  with .29 cm thickness in mid arch. Right plantar fascia was 0.42 cm.  Impression Consistent with plantar fasciitis with no sign of tear  Ultrasound and interpretation by Wolfgang Phoenix. Fields, MD   ASSESSMENT & PLAN: See problem based charting & AVS for pt instructions.  1. Left foot pain Most consistent with plantar fascitis. Documented by [place] and exam. Patient had 50% increase in swelling of left palntar fascia.  -instructions to ice and use OTC NSAIDs prn -can continue running if she is not limping -HEP given to including stretches of plantar fascia -sports insoles with scaphoid pads added to running shoes -may need custom orthotics eventually Orders:  - [place] LIMITED JOINT SPACE STRUCTURES LOW LEFT; Future  Luiz Blare, DO 11/09/2016, 2:12 PM PGY-3, [facility]  I observed and examined the patient with the resident and agree with assessment and plan.  Note reviewed and modified by me. Stefanie Libel, MD

## 2017-03-06 ENCOUNTER — Other Ambulatory Visit: Payer: Self-pay | Admitting: Pulmonary Disease

## 2017-04-21 ENCOUNTER — Other Ambulatory Visit: Payer: Self-pay | Admitting: Obstetrics and Gynecology

## 2017-04-21 DIAGNOSIS — Z803 Family history of malignant neoplasm of breast: Secondary | ICD-10-CM

## 2018-10-13 ENCOUNTER — Other Ambulatory Visit: Payer: Self-pay | Admitting: Obstetrics and Gynecology

## 2018-10-13 DIAGNOSIS — Z1231 Encounter for screening mammogram for malignant neoplasm of breast: Secondary | ICD-10-CM

## 2018-10-26 ENCOUNTER — Telehealth: Payer: Self-pay

## 2018-10-26 ENCOUNTER — Encounter: Payer: Self-pay | Admitting: Family Medicine

## 2018-10-26 ENCOUNTER — Ambulatory Visit: Payer: 59 | Admitting: Family Medicine

## 2018-10-26 VITALS — BP 120/70 | HR 68 | Temp 98.2°F | Ht 65.0 in | Wt 154.0 lb

## 2018-10-26 DIAGNOSIS — H15101 Unspecified episcleritis, right eye: Secondary | ICD-10-CM

## 2018-10-26 DIAGNOSIS — H5789 Other specified disorders of eye and adnexa: Secondary | ICD-10-CM | POA: Diagnosis not present

## 2018-10-26 MED ORDER — PREDNISOLONE ACETATE 1 % OP SUSP: 1.0000 [drp] | Freq: Two times a day (BID) | OPHTHALMIC | 0 refills | Status: DC

## 2018-10-26 MED ORDER — TRIAMCINOLONE ACETONIDE 40 MG/ML IO SUSP: 4.0000 mg | Freq: Two times a day (BID) | INTRAOCULAR | 0 refills | Status: DC

## 2018-10-26 NOTE — Patient Instructions (Addendum)
Use Replens or eye moistening eye drops often.  You can also use Visine Cool compresses.  Try the steroid eye drops twice daily.   If your symptoms worsen, go to the eye doctor.    Scleritis and Episcleritis  Scleritis and episcleritis are redness and inflammation of the white part of the eye (sclera). Episcleritis involves only the surface of the sclera. Episcleritis is a mild condition that does not affect your vision. It usually clears up in a few days without treatment. Scleritis involves the body of the sclera. It is a serious condition that may cause severe pain and some vision loss. Scleritis often needs to be treated with strong medicines. Both of these conditions may affect one or both eyes. These two conditions may occur along with other diseases that cause inflammation, including diseases in which your body's defense system (immune system) mistakenly attacks normal body tissues (autoimmune diseases). Rheumatoid arthritis is one common autoimmune disease that may cause episcleritis or scleritis. Other diseases that may cause these conditions include Crohn disease, lupus, and inflammatory bowel disease. What are the causes? Scleritis is usually caused by an autoimmune disease. Occasionally, it may be caused by infection. Episcleritis may also be caused by eye infections and irritations. In some cases, the cause of these conditions is not known. What increases the risk? These conditions are more likely to develop in:  Women.  People who are between 90 and 80 years of age.  People who have an autoimmune disease, especially rheumatoid arthritis. What are the signs or symptoms? Symptoms of episcleritis include:  Redness of the sclera. This may go away after several days.  Mild pain. Symptoms of scleritis include:  Redness of the sclera.  Moderate to severe pain.  Pain that gets much worse with light exposure (photophobia).  Tearing.  Soreness when touched  (tenderness).  Vision problems. This can range from mild blurred vision to severe vision loss. How is this diagnosed? These conditions may be diagnosed based on your symptoms, your medical history, and a physical exam. You may need to see a health care provider who specializes in diseases and conditions of the eye (ophthalmologist). An ophthalmologist may:  Put drops in your eye.  Examine your eye with a certain type of light (slit lamp).  Order blood tests or refer you to a specialist to check for autoimmune disease. How is this treated? Episcleritis often gets better without treatment after several days. During this time, treatment may include:  Eye drops.  Cold compresses.  NSAID medicines to reduce discomfort and swelling. Scleritis is a serious condition that needs to be treated. Treatment may include:  Eye drops.  NSAIDs.  Prescription pain medicines.  Steroid medicines.  Medicines that suppress the immune system.  Treatment of an underlying autoimmune disease.  Eye surgery. This is rare. Follow these instructions at home:  Take over-the-counter and prescription medicines only as told by your health care provider.  Use eye drops only as told by your health care provider.  If directed, use cold compresses over your eye or eyes to help relieve pain.  Do not wear contact lenses until your health care provider says that you can.  Do not drive or operate heavy machinery while taking prescription pain medicine.  Return to your normal activities as told by your health care provider. Ask your health care provider what activities are safe for you.  Keep all follow-up visits as told by your health care provider. This is important. Contact a health care provider  if:  Your symptoms have not gotten better in the expected amount of time. This can take days to weeks.  You develop new symptoms.  You develop worsening pain, photophobia, or blurred vision.  Your symptoms  come back after treatment. Get help right away if:  You have severe pain or severe photophobia.  You have sudden vision loss. This information is not intended to replace advice given to you by your health care provider. Make sure you discuss any questions you have with your health care provider. Document Released: 09/07/2001 Document Revised: 10/15/2015 Document Reviewed: 08/05/2015 Elsevier Interactive Patient Education  2019 [employer].

## 2018-10-26 NOTE — Telephone Encounter (Signed)
Triesence was sent to the pharmacy which is an injection. This medication is not sent as an eye drop. 2 of the cheaper eyedrops would be Dexamethasone and Prednisolone. Please send med.

## 2018-10-26 NOTE — Telephone Encounter (Signed)
New prescription was sent in. Thanks.

## 2018-10-26 NOTE — Progress Notes (Signed)
Subjective:    Patient ID: Tanya Vaughn, female    DOB: 1971-09-11, 48 y.o.   MRN: [id number]  HPI Chief Complaint  Patient presents with  . new pt    new pt, eye redness since last thursday, throbbing pain under eye and hurts to touch eye lid which is intermate. no blurred vision, dizziness, or itchying   She is new to the practice and here to establish care.  She has an acute complaint of right eye redness for the past week. Reports yesterday she noticed tenderness to her right upper and lower lid.  She wears contact lenses but has not been wearing them lately.  She has glasses on today. Denies injury.  She does have underlying allergies and has "sneezing fits" and not sure if this could have triggered her problem. Denies fever, chills, vision changes, itching, drainage, headache, nausea, vomiting. She denies photophobia.  No foreign body sensation.  She has underlying allergies and did see her allergist earlier today.  She had allergy testing done.  She does have an eye doctor but states her insurance only covers annual exams.  She prefers to not go to her eye doctor for this issue if she does not have to.  Previous medical care:[facility] walk in when needed.   Other providers: OB/GYN- Dr. Garwin Brothers  Eye- Dr. Gershon Crane  Pulmonologist- Dr. Halford Chessman Allergist- Dr. Remus Blake   Asthma- exercise induced. Breo daily. Controlled.   Married with 2 kids. 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 Works for the [employer] Pertinent positives and negatives in the history of present illness.     Objective:   Physical Exam Constitutional:      Appearance: Normal appearance.  HENT:     Right Ear: Hearing, tympanic membrane and ear canal normal.     Left Ear: Hearing, tympanic membrane and ear canal normal.     Mouth/Throat:     Lips: Pink.     Mouth: Mucous membranes are moist.     Pharynx: Oropharynx is clear.  Eyes:     General: Lids are everted,  no foreign bodies appreciated. Vision grossly intact.        Right eye: No foreign body, discharge or hordeolum.     Extraocular Movements: Extraocular movements intact.     Conjunctiva/sclera:     Right eye: Right conjunctiva is injected.   Neck:     Musculoskeletal: Normal range of motion and neck supple.  Lymphadenopathy:     Cervical: No cervical adenopathy.  Skin:    General: Skin is warm and dry.  Neurological:     Mental Status: She is alert.    BP 120/70   Pulse 68   Temp 98.2 F (36.8 C) (Oral)   Ht 5\' 5"  (1.651 m)   Wt 154 lb (69.9 kg)   LMP 10/26/2018   BMI 25.63 kg/m      Assessment & Plan:  Episcleritis of right eye - Plan: prednisoLONE acetate (PRED FORTE) 1 % ophthalmic suspension, DISCONTINUED: triamcinolone acetonide (TRIESENCE) 40 MG/ML SUSP  Red eye - Plan: prednisoLONE acetate (PRED FORTE) 1 % ophthalmic suspension, DISCONTINUED: triamcinolone acetonide (TRIESENCE) 40 MG/ML SUSP  I consulted with Dorothea Ogle, PA and Dr. Tomi Bamberger regarding this patient.  Discussed that she does not appear to have conjunctivitis and does not appear to have iritis or uveitis. Floor seen stain used and I was examined under [place] lamp.  There were no abrasions, ulcerations  or foreign bodies present. We will prescribe short-term twice daily use topical steroids.  She will also use moistening drops frequently and she may also use Visine.  Recommend cool compresses.  If she still has discomfort, she will also try over-the-counter ibuprofen. Discussed red flag symptoms and that she will need to see her eye doctor if she has any worsening symptoms or is not improving.  I will have her return here on Monday to see how she is doing.

## 2018-10-26 NOTE — Telephone Encounter (Signed)
Pharmacy aware

## 2018-10-30 ENCOUNTER — Ambulatory Visit: Payer: 59 | Admitting: Family Medicine

## 2018-10-30 ENCOUNTER — Encounter: Payer: Self-pay | Admitting: Family Medicine

## 2018-10-30 VITALS — BP 110/68 | HR 68

## 2018-10-30 DIAGNOSIS — H5789 Other specified disorders of eye and adnexa: Secondary | ICD-10-CM

## 2018-10-30 NOTE — Progress Notes (Signed)
   Subjective:    Patient ID: Tanya Vaughn, female    DOB: 1971-06-07, 48 y.o.   MRN: [id number]  HPI Chief Complaint  Patient presents with  . follow-up    follow-up on eye. doing much better. no pain    Is here to follow-up on right eye redness.  Presented as a new patient 4 days ago and presumed diagnosis was episcleritis.  We treated her with topical steroids and she has been doing fine with this.  Redness today has improved significantly.  Denies vision changes, discharge, eye pain.  No new symptoms.  Denies fever, chills.    Review of Systems Pertinent positives and negatives in the history of present illness.     Objective:   Physical Exam BP 110/68   Pulse 68   LMP 10/26/2018   SpO2 99%   Right eye lids without erythema, edema, no discharge.  Conjunctiva redness significantly improved.  Vision intact, PERRLA.  EOMs intact      Assessment & Plan:  Redness of right eye  Appears to be doing much better since using prednisolone topical.  Encouraged her to finish out the week on this medication and if she notices any worsening symptoms or if symptoms return after stopping the steroids that she will need to follow-up with her eye doctor.  She verbalized understanding.

## 2018-11-17 ENCOUNTER — Ambulatory Visit: Payer: Managed Care, Other (non HMO)

## 2018-12-18 ENCOUNTER — Ambulatory Visit: Payer: Self-pay

## 2019-01-19 ENCOUNTER — Ambulatory Visit: Payer: Self-pay

## 2019-02-27 ENCOUNTER — Telehealth: Payer: Self-pay | Admitting: Internal Medicine

## 2019-02-27 NOTE — Telephone Encounter (Signed)
Left message for pt to call back to schedule cpe

## 2019-03-15 ENCOUNTER — Ambulatory Visit
Admission: RE | Admit: 2019-03-15 | Discharge: 2019-03-15 | Disposition: A | Discharge status: Home / Self Care | Payer: No Typology Code available for payment source | Source: Ambulatory Visit | Attending: Obstetrics and Gynecology | Admitting: Obstetrics and Gynecology

## 2019-03-15 ENCOUNTER — Other Ambulatory Visit: Payer: Self-pay

## 2019-03-15 DIAGNOSIS — Z1231 Encounter for screening mammogram for malignant neoplasm of breast: Secondary | ICD-10-CM

## 2020-11-14 ENCOUNTER — Telehealth: Payer: Self-pay | Admitting: Pulmonary Disease

## 2020-11-14 MED ORDER — PREDNISONE 10 MG PO TABS: ORAL_TABLET | ORAL | 0 refills | Status: AC

## 2020-11-14 NOTE — Telephone Encounter (Signed)
Her husband contracted COVID 19 infection.  She then got sick also.  Had mild URI symptoms and didn't require specific COVID therapy.  She has noticed over the past several days having more wheezing and chest tightness.  Has partially improvement with breo and albuterol.  Hasn't been able to keep up with her running regimen for past few days.  Will send script for prednisone taper.  If no improvement, then advise her to contact office for [place].

## 2021-03-04 ENCOUNTER — Ambulatory Visit: Payer: 59 | Admitting: Family Medicine

## 2021-03-13 ENCOUNTER — Ambulatory Visit (INDEPENDENT_AMBULATORY_CARE_PROVIDER_SITE_OTHER): Payer: 59 | Admitting: Family Medicine

## 2021-03-13 ENCOUNTER — Other Ambulatory Visit: Payer: Self-pay

## 2021-03-13 ENCOUNTER — Encounter: Payer: Self-pay | Admitting: Family Medicine

## 2021-03-13 VITALS — BP 110/76 | HR 66 | Ht 66.5 in | Wt 152.0 lb

## 2021-03-13 DIAGNOSIS — Z1211 Encounter for screening for malignant neoplasm of colon: Secondary | ICD-10-CM

## 2021-03-13 DIAGNOSIS — Z23 Encounter for immunization: Secondary | ICD-10-CM | POA: Diagnosis not present

## 2021-03-13 DIAGNOSIS — J4520 Mild intermittent asthma, uncomplicated: Secondary | ICD-10-CM | POA: Diagnosis not present

## 2021-03-13 DIAGNOSIS — Z0000 Encounter for general adult medical examination without abnormal findings: Secondary | ICD-10-CM

## 2021-03-13 DIAGNOSIS — Z13220 Encounter for screening for lipoid disorders: Secondary | ICD-10-CM

## 2021-03-13 DIAGNOSIS — Z1329 Encounter for screening for other suspected endocrine disorder: Secondary | ICD-10-CM | POA: Diagnosis not present

## 2021-03-13 DIAGNOSIS — Z1159 Encounter for screening for other viral diseases: Secondary | ICD-10-CM

## 2021-03-13 DIAGNOSIS — R519 Headache, unspecified: Secondary | ICD-10-CM

## 2021-03-13 NOTE — Progress Notes (Signed)
Subjective:    Patient ID: Tanya Vaughn, female    DOB: 1971/07/20, 50 y.o.   MRN: [id number]  HPI Chief Complaint  Patient presents with   fasting cpe     Fasting cpe, has a new obgyn appt in october   She is fairly new to the practice and here for a complete physical exam.  Other providers: OB/GYN - Dr. Edwinna Areola Pulmonologist- Dr. Halford Chessman  Eyes- Dr. Gershon Crane  Allergist   States she wakes up with headaches some days. Ongoing for the past 3 months. Occurs 2-3 days per week. Last headache last week. Currently asymptomatic.   Pain is a 5/10. Advil does not help. No hx of sleep apnea.  Denies dizziness, neck pain, ear pain, numbness, tingling or weakness.   She travels for work, flys to [place] monthly.  Hx of allergies and asthma.   Reports asthma well controlled. Sees a specialist.   Social history: Lives  works as a Administrator, sports for [employer]  Denies smoking or drug use  Diet: healthy  Excerise: walks regularly   Immunizations: needs Tdap today. Consider Shingrix. She had 3 Covid vaccines.   Health maintenance:   Mammogram: 02/2019 Colonoscopy: never  Last Gynecological Exam: 2020 Last Menstrual cycle: 02/26/2021 Last Dental Exam: [facility], one year ago  Last Eye Exam: fall 2021  Wears seatbelt always, uses sunscreen, smoke detectors in home and functioning, does not text while driving and feels safe in home environment.   Reviewed allergies, medications, past medical, surgical, family, and social history.    Review of Systems Review of Systems Constitutional: -fever, -chills, -sweats, -unexpected weight change,-fatigue ENT: -runny nose, -ear pain, -sore throat Cardiology:  -chest pain, -palpitations, -edema Respiratory: -cough, -shortness of breath, -wheezing Gastroenterology: -abdominal pain, -nausea, -vomiting, -diarrhea, -constipation  Hematology: -bleeding or bruising problems Musculoskeletal: -arthralgias, -myalgias, -joint  swelling, -back pain Ophthalmology: -vision changes Urology: -dysuria, -difficulty urinating, -hematuria, -urinary frequency, -urgency Neurology: +headache, -weakness, -tingling, -numbness       Objective:   Physical Exam BP 110/76   Pulse 66   Ht 5' 6.5" (1.689 m)   Wt 152 lb (68.9 kg)   LMP 02/26/2021   BMI 24.17 kg/m   General Appearance:    Alert, cooperative, no distress, appears stated age  Head:    Normocephalic, without obvious abnormality, atraumatic  Eyes:    PERRL, conjunctiva/corneas clear, EOM's intact  Ears:    Normal TM's and external ear canals  Nose:   Mask on   Throat:   Mask on   Neck:   Supple, no lymphadenopathy;  thyroid:  no   enlargement/tenderness/nodules; no JVD  Back:    Spine nontender, no curvature, ROM normal, no CVA     tenderness  Lungs:     Clear to auscultation bilaterally without wheezes, rales or     ronchi; respirations unlabored  Chest Wall:    No tenderness or deformity   Heart:    Regular rate and rhythm, S1 and S2 normal, no murmur, rub   or gallop  Breast Exam:    OB/GYN  Abdomen:     Soft, non-tender, nondistended, normoactive bowel sounds,    no masses, no hepatosplenomegaly  Genitalia:    OB/GYN     Extremities:   No clubbing, cyanosis or edema  Pulses:   2+ and symmetric all extremities  Skin:   Skin color, texture, turgor normal, no rashes or lesions  Lymph nodes:   Cervical, supraclavicular, and axillary nodes normal  Neurologic:   CNII-XII intact, normal strength, sensation and gait          Psych:   Normal mood, affect, hygiene and grooming.         Assessment & Plan:  Routine general medical examination at a health care facility - Plan: CBC with Differential/Platelet, Comprehensive metabolic panel, TSH, T3, Lipid panel -Preventive health care reviewed.  She has an appointment with her OB/GYN soon for mammogram and Pap smear.  Referral to GI for her for screening colonoscopy.  Counseling on healthy lifestyle including  diet and exercise.  Recommend regular dental and eye exams.  Immunizations reviewed.  Discussed safety and health promotion.  Mild intermittent asthma without complication -Controlled  Intermittent headache -No obvious etiology.  Encouraged her to keep a headache diary and discussed getting regular sleep, staying hydrated, avoiding fluctuations in caffeine or skipping meals.  Reduce the amount of NSAID she is taking.  Follow-up if headaches are worsening or becoming more frequent.  Need for diphtheria-tetanus-pertussis (Tdap) vaccine -Counseling done on all components of the vaccine  Screen for colon cancer - Plan: Ambulatory referral to Gastroenterology  Screening for thyroid disorder - Plan: TSH, T3  Screening for lipid disorders - Plan: Lipid panel  Need for hepatitis C screening test - Plan: Hepatitis C antibody -Done per screening guidelines

## 2021-03-13 NOTE — Patient Instructions (Addendum)
Keep a headache diary and let me know if you are having them more frequent or if you have any new symptoms.  Stay well hydrated, avoid fluctuations in caffeine, skipping meals or daily NSAID use.  Try to get regular sleep.   You will hear from [facility]  Consider getting the Shingles vaccine but check with your insurance carrier regarding the cost.   Continue getting 1,200 mg of calcium in your diet, taking a daily vitamin D supplement and doing weight bearing exercises.    Preventive Care 10-5 Years Old, Female Preventive care refers to lifestyle choices and visits with your health care provider that can promote health and wellness. This includes: A yearly physical exam. This is also called an annual wellness visit. Regular dental and eye exams. Immunizations. Screening for certain conditions. Healthy lifestyle choices, such as: Eating a healthy diet. Getting regular exercise. Not using drugs or products that contain nicotine and tobacco. Limiting alcohol use. What can I expect for my preventive care visit? Physical exam Your health care provider will check your: Height and weight. These may be used to calculate your BMI (body mass index). BMI is a measurement that tells if you are at a healthy weight. Heart rate and blood pressure. Body temperature. Skin for abnormal spots. Counseling Your health care provider may ask you questions about your: Past medical problems. Family's medical history. Alcohol, tobacco, and drug use. Emotional well-being. Home life and relationship well-being. Sexual activity. Diet, exercise, and sleep habits. Work and work Statistician. Access to firearms. Method of birth control. Menstrual cycle. Pregnancy history. What immunizations do I need?  Vaccines are usually given at various ages, according to a schedule. Your health care provider will recommend vaccines for you based on your age, medicalhistory, and lifestyle or other factors, such as  travel or where you work. What tests do I need? Blood tests Lipid and cholesterol levels. These may be checked every 5 years, or more often if you are over 100 years old. Hepatitis C test. Hepatitis B test. Screening Lung cancer screening. You may have this screening every year starting at age 35 if you have a 30-pack-year history of smoking and currently smoke or have quit within the past 15 years. Colorectal cancer screening. All adults should have this screening starting at age 5 and continuing until age 40. Your health care provider may recommend screening at age 32 if you are at increased risk. You will have tests every 1-10 years, depending on your results and the type of screening test. Diabetes screening. This is done by checking your blood sugar (glucose) after you have not eaten for a while (fasting). You may have this done every 1-3 years. Mammogram. This may be done every 1-2 years. Talk with your health care provider about when you should start having regular mammograms. This may depend on whether you have a family history of breast cancer. BRCA-related cancer screening. This may be done if you have a family history of breast, ovarian, tubal, or peritoneal cancers. Pelvic exam and Pap test. This may be done every 3 years starting at age 72. Starting at age 38, this may be done every 5 years if you have a Pap test in combination with an HPV test. Other tests STD (sexually transmitted disease) testing, if you are at risk. Bone density scan. This is done to screen for osteoporosis. You may have this scan if you are at high risk for osteoporosis. Talk with your health care provider about your test  results, treatment options,and if necessary, the need for more tests. Follow these instructions at home: Eating and drinking  Eat a diet that includes fresh fruits and vegetables, whole grains, lean protein, and low-fat dairy products. Take vitamin and mineral supplements as  recommended by your health care provider. Do not drink alcohol if: Your health care provider tells you not to drink. You are pregnant, may be pregnant, or are planning to become pregnant. If you drink alcohol: Limit how much you have to 0-1 drink a day. Be aware of how much alcohol is in your drink. In the [place] one drink equals one 12 oz bottle of beer (355 mL), one 5 oz glass of wine (148 mL), or one 1 oz glass of hard liquor (44 mL).  Lifestyle Take daily care of your teeth and gums. Brush your teeth every morning and night with fluoride toothpaste. Floss one time each day. Stay active. Exercise for at least 30 minutes 5 or more days each week. Do not use any products that contain nicotine or tobacco, such as cigarettes, e-cigarettes, and chewing tobacco. If you need help quitting, ask your health care provider. Do not use drugs. If you are sexually active, practice safe sex. Use a condom or other form of protection to prevent STIs (sexually transmitted infections). If you do not wish to become pregnant, use a form of birth control. If you plan to become pregnant, see your health care provider for a prepregnancy visit. If told by your health care provider, take low-dose aspirin daily starting at age 20. Find healthy ways to cope with stress, such as: Meditation, yoga, or listening to music. Journaling. Talking to a trusted person. Spending time with friends and family. Safety Always wear your seat belt while driving or riding in a vehicle. Do not drive: If you have been drinking alcohol. Do not ride with someone who has been drinking. When you are tired or distracted. While texting. Wear a helmet and other protective equipment during sports activities. If you have firearms in your house, make sure you follow all gun safety procedures. What's next? Visit your health care provider once a year for an annual wellness visit. Ask your health care provider how often you should have your  eyes and teeth checked. Stay up to date on all vaccines. This information is not intended to replace advice given to you by your health care provider. Make sure you discuss any questions you have with your healthcare provider. Document Revised: 06/17/2020 Document Reviewed: 05/25/2018 Elsevier Patient Education  2022 [employer].

## 2021-03-14 LAB — COMPREHENSIVE METABOLIC PANEL
ALT: 10 IU/L (ref 0–32)
AST: 13 IU/L (ref 0–40)
Albumin/Globulin Ratio: 1.7 (ref 1.2–2.2)
Albumin: 4.3 g/dL (ref 3.8–4.8)
Alkaline Phosphatase: 58 IU/L (ref 44–121)
BUN/Creatinine Ratio: 16 (ref 9–23)
BUN: 13 mg/dL (ref 6–24)
Bilirubin Total: 1.5 mg/dL — ABNORMAL HIGH (ref 0.0–1.2)
CO2: 20 mmol/L (ref 20–29)
Calcium: 9.1 mg/dL (ref 8.7–10.2)
Chloride: 103 mmol/L (ref 96–106)
Creatinine, Ser: 0.79 mg/dL (ref 0.57–1.00)
Globulin, Total: 2.6 g/dL (ref 1.5–4.5)
Glucose: 83 mg/dL (ref 65–99)
Potassium: 4.6 mmol/L (ref 3.5–5.2)
Sodium: 138 mmol/L (ref 134–144)
Total Protein: 6.9 g/dL (ref 6.0–8.5)
eGFR: 91 mL/min/{1.73_m2} (ref >59)

## 2021-03-14 LAB — CBC WITH DIFFERENTIAL/PLATELET
Basophils Absolute: 0.2 10*3/uL (ref 0.0–0.2)
Basos: 3 %
EOS (ABSOLUTE): 0.8 10*3/uL — ABNORMAL HIGH (ref 0.0–0.4)
Eos: 13 %
Hematocrit: 40.7 % (ref 34.0–46.6)
Hemoglobin: 13.7 g/dL (ref 11.1–15.9)
Immature Grans (Abs): 0 10*3/uL (ref 0.0–0.1)
Immature Granulocytes: 0 %
Lymphocytes Absolute: 1.7 10*3/uL (ref 0.7–3.1)
Lymphs: 28 %
MCH: 30.2 pg (ref 26.6–33.0)
MCHC: 33.7 g/dL (ref 31.5–35.7)
MCV: 90 fL (ref 79–97)
Monocytes Absolute: 0.4 10*3/uL (ref 0.1–0.9)
Monocytes: 7 %
Neutrophils Absolute: 3 10*3/uL (ref 1.4–7.0)
Neutrophils: 49 %
Platelets: 369 10*3/uL (ref 150–450)
RBC: 4.54 x10E6/uL (ref 3.77–5.28)
RDW: 12.2 % (ref 11.7–15.4)
WBC: 6.1 10*3/uL (ref 3.4–10.8)

## 2021-03-14 LAB — LIPID PANEL
Chol/HDL Ratio: 3.7 ratio (ref 0.0–4.4)
Cholesterol, Total: 179 mg/dL (ref 100–199)
HDL: 48 mg/dL (ref >39)
LDL Chol Calc (NIH): 112 mg/dL — ABNORMAL HIGH (ref 0–99)
Triglycerides: 106 mg/dL (ref 0–149)
VLDL Cholesterol Cal: 19 mg/dL (ref 5–40)

## 2021-03-14 LAB — HEPATITIS C ANTIBODY: Hep C Virus Ab: <0.1 s/co ratio (ref 0.0–0.9)

## 2021-03-14 LAB — T3: T3, Total: 152 ng/dL (ref 71–180)

## 2021-03-14 LAB — TSH: TSH: 1.62 u[IU]/mL (ref 0.450–4.500)

## 2021-03-16 NOTE — Progress Notes (Signed)
Please ask Verdis Frederickson to add a fractionated bilirubin due to elevated serum bilirubin.

## 2021-03-18 LAB — BILIRUBIN, FRACTIONATED(TOT/DIR/INDIR)
Bilirubin Total: 1.2 mg/dL (ref 0.0–1.2)
Bilirubin, Direct: 0.23 mg/dL (ref 0.00–0.40)
Bilirubin, Indirect: 0.97 mg/dL — ABNORMAL HIGH (ref 0.10–0.80)

## 2021-03-18 LAB — SPECIMEN STATUS REPORT

## 2021-06-30 ENCOUNTER — Ambulatory Visit (HOSPITAL_BASED_OUTPATIENT_CLINIC_OR_DEPARTMENT_OTHER): Payer: No Typology Code available for payment source | Admitting: Obstetrics & Gynecology

## 2021-07-08 ENCOUNTER — Other Ambulatory Visit (HOSPITAL_COMMUNITY)
Admission: RE | Admit: 2021-07-08 | Discharge: 2021-07-08 | Disposition: A | Discharge status: Home / Self Care | Payer: 59 | Source: Ambulatory Visit | Attending: Obstetrics & Gynecology | Admitting: Obstetrics & Gynecology

## 2021-07-08 ENCOUNTER — Ambulatory Visit (INDEPENDENT_AMBULATORY_CARE_PROVIDER_SITE_OTHER): Payer: 59 | Admitting: Obstetrics & Gynecology

## 2021-07-08 ENCOUNTER — Encounter (HOSPITAL_BASED_OUTPATIENT_CLINIC_OR_DEPARTMENT_OTHER): Payer: Self-pay | Admitting: Obstetrics & Gynecology

## 2021-07-08 ENCOUNTER — Other Ambulatory Visit: Payer: Self-pay

## 2021-07-08 ENCOUNTER — Telehealth: Payer: Self-pay | Admitting: Licensed Clinical Social Worker

## 2021-07-08 VITALS — BP 122/65 | HR 67 | Ht 66.5 in | Wt 155.8 lb

## 2021-07-08 DIAGNOSIS — J309 Allergic rhinitis, unspecified: Secondary | ICD-10-CM | POA: Insufficient documentation

## 2021-07-08 DIAGNOSIS — Z803 Family history of malignant neoplasm of breast: Secondary | ICD-10-CM

## 2021-07-08 DIAGNOSIS — Z124 Encounter for screening for malignant neoplasm of cervix: Secondary | ICD-10-CM

## 2021-07-08 DIAGNOSIS — Z1231 Encounter for screening mammogram for malignant neoplasm of breast: Secondary | ICD-10-CM | POA: Diagnosis not present

## 2021-07-08 DIAGNOSIS — Z9189 Other specified personal risk factors, not elsewhere classified: Secondary | ICD-10-CM

## 2021-07-08 DIAGNOSIS — Z23 Encounter for immunization: Secondary | ICD-10-CM | POA: Diagnosis not present

## 2021-07-08 NOTE — Progress Notes (Addendum)
50 y.o. G2P2 MWF for new patient exam.  She is still having cycles.  Cycles are regular and last two days.  Has not had a Pap smear in several years.  Has lived in [place] for many years.  Used to see [employer].  Friend is [employer].  Mother with hx of breast cancer, diagnosed at age 10.  Tyrer Cusick risk assessment today was 25.8%.    Patient's last menstrual period was 06/24/2021.          Sexually active: Yes.    The current method of family planning is vasectomy.    Exercising: Yes.     Smoker:  no  Health Maintenance: Pap:  updated today History of abnormal Pap:  no MMG:  03/15/2019 Colonoscopy:  never done.  Has been referred. Screening Labs: done in June   reports that she has never smoked. She has never used smokeless tobacco. She reports current alcohol use. She reports that she does not use drugs.  Past Medical History:  Diagnosis Date   Asthma     Past Surgical History:  Procedure Laterality Date   WISDOM TOOTH EXTRACTION      Current Outpatient Medications  Medication Sig Dispense Refill   albuterol (VENTOLIN HFA) 108 (90 Base) MCG/ACT inhaler 2 PUFFS INHALATION EVERY 4 TO 6 HOURS AS NEEDED FOR COUGH/WHEEZE 90 DAYS     fluticasone furoate-vilanterol (BREO ELLIPTA) 100-25 MCG/INH AEPB Inhale 1 puff into the lungs daily. 60 each 6   Ibuprofen (ADVIL PO) Take by mouth.     montelukast (SINGULAIR) 10 MG tablet Take 10 mg by mouth every evening.  5   Multiple Vitamin (MULTIVITAMIN) tablet Take 1 tablet by mouth daily.     No current facility-administered medications for this visit.    Family History  Problem Relation Age of Onset   Breast cancer Mother    Emphysema Maternal Grandmother    Stomach cancer Paternal Grandfather     Review of Systems  All other systems reviewed and are negative.  Exam:   BP 122/65 (BP Location: Right Arm, Patient Position: Sitting, Cuff Size: Normal)   Pulse 67   Ht 5' 6.5" (1.689 m)   Wt 155 lb 12.8 oz (70.7  kg)   LMP 06/24/2021   BMI 24.77 kg/m   Height: 5' 6.5" (168.9 cm)  General appearance: alert, cooperative and appears stated age Head: Normocephalic, without obvious abnormality, atraumatic Neck: no adenopathy, supple, symmetrical, trachea midline and thyroid normal to inspection and palpation Lungs: clear to auscultation bilaterally Breasts: normal appearance, no masses or tenderness Heart: regular rate and rhythm Abdomen: soft, non-tender; bowel sounds normal; no masses,  no organomegaly Extremities: extremities normal, atraumatic, no cyanosis or edema Skin: Skin color, texture, turgor normal. No rashes or lesions Lymph nodes: Cervical, supraclavicular, and axillary nodes normal. No abnormal inguinal nodes palpated Neurologic: Grossly normal   Pelvic: External genitalia:  no lesions              Urethra:  normal appearing urethra with no masses, tenderness or lesions              Bartholins and Skenes: normal                 Vagina: normal appearing vagina with normal color and no discharge, no lesions              Cervix: no lesions              Pap taken: Yes.  Bimanual Exam:  Uterus:  normal size, contour, position, consistency, mobility, non-tender              Adnexa: normal adnexa and no mass, fullness, tenderness               Rectovaginal: Confirms               Anus:  normal sphincter tone, no lesions  Chaperone, Octaviano Batty, CMA, was present for exam.  Assessment/Plan: 1. Family history of breast cancer - reviewed history.  Tyrer Cusick model done today with lifetime risk 25.8% - MM 3D SCREEN BREAST BILATERAL; Future - Ambulatory referral to Genetics  2. Cervical cancer screening - Cytology - PAP( [place])  3. Increased risk of breast cancer - Ambulatory referral to Genetics  4.  Immunizations - influenza vaccination given today

## 2021-07-08 NOTE — Telephone Encounter (Signed)
Scheduled appt per 10/12 referral. Pt is aware of appt date and time.  

## 2021-07-09 ENCOUNTER — Encounter: Payer: Self-pay | Admitting: Gastroenterology

## 2021-07-09 LAB — CYTOLOGY - PAP
Comment: NEGATIVE
Diagnosis: NEGATIVE
High risk HPV: NEGATIVE

## 2021-07-20 ENCOUNTER — Encounter: Payer: Self-pay | Admitting: Licensed Clinical Social Worker

## 2021-07-20 ENCOUNTER — Other Ambulatory Visit: Payer: Self-pay

## 2021-07-20 ENCOUNTER — Inpatient Hospital Stay: Payer: 59 | Attending: Obstetrics & Gynecology | Admitting: Licensed Clinical Social Worker

## 2021-07-20 ENCOUNTER — Inpatient Hospital Stay: Payer: 59

## 2021-07-20 DIAGNOSIS — Z803 Family history of malignant neoplasm of breast: Secondary | ICD-10-CM | POA: Diagnosis not present

## 2021-07-20 DIAGNOSIS — Z800 Family history of malignant neoplasm of digestive organs: Secondary | ICD-10-CM | POA: Diagnosis not present

## 2021-07-20 NOTE — Progress Notes (Signed)
REFERRING PROVIDER: Megan Salon, MD [address],  [place] [address]  PRIMARY PROVIDER:  Girtha Rm, PA-C  PRIMARY REASON FOR VISIT:  1. Family history of breast cancer   2. Family history of stomach cancer      HISTORY OF PRESENT ILLNESS:   Tanya Vaughn, a 50 y.o. female, was seen for a [place] cancer genetics consultation at the request of Dr. Sabra Heck due to a family history of breast cancer.  Tanya Vaughn presents to clinic today to discuss the possibility of a hereditary predisposition to cancer, genetic testing, and to further clarify her future cancer risks, as well as potential cancer risks for family members.   Tanya Vaughn is a 50 y.o. female with no personal history of cancer.    CANCER HISTORY:  Oncology History   No history exists.     RISK FACTORS:  Menarche was at age 46.  First live birth at age 44.  OCP use for approximately 0 years.  Ovaries intact: yes.  Hysterectomy: no.  Menopausal status: premenopausal.  HRT use: 0 years. Colonoscopy: no;  referred for one . Mammogram within the last year: no, will have one soon Number of breast biopsies: 0. Up to date with pelvic exams: yes. Any excessive radiation exposure in the past: no  Past Medical History:  Diagnosis Date   Asthma    Family history of breast cancer    Family history of stomach cancer     Past Surgical History:  Procedure Laterality Date   WISDOM TOOTH EXTRACTION      Social History   Socioeconomic History   Marital status: Married    Spouse name: Not on file   Number of children: Not on file   Years of education: Not on file   Highest education level: Not on file  Occupational History   Occupation: registered dietician  Tobacco Use   Smoking status: Never   Smokeless tobacco: Never  Substance and Sexual Activity   Alcohol use: Yes    Alcohol/week: 0.0 standard drinks    Comment: ocassional - 2x monthly    Drug use: No   Sexual activity:  Not on file  Other Topics Concern   Not on file  Social History Narrative   Not on file   Social Determinants of Health   Financial Resource Strain: Not on file  Food Insecurity: Not on file  Transportation Needs: Not on file  Physical Activity: Not on file  Stress: Not on file  Social Connections: Not on file     FAMILY HISTORY:  We obtained a detailed, 4-generation family history.  Significant diagnoses are listed below: Family History  Problem Relation Age of Onset   Breast cancer Mother 27       mastectomy, chemo/radiation   Alzheimer's disease Father    Emphysema Maternal Grandmother    Stomach cancer Paternal Grandfather        dx 79s   Tanya Vaughn has 2 sons (58 and 49). Neither have had cancer. She has 1 brother (52), no cancer.   Tanya Vaughn mother had breast cancer at 52 and is living at 33. Patient does not believe she had genetic testing. She does not have siblings. Maternal grandmother died of dementia in her 46s, grandfather died young in [place] War II.   Tanya Vaughn father is living at 29 with Alzheimer's. Patient had 1 paternal uncle, he passed in his 10s, no cancer. No known cancers in cousins. Paternal grandmother  died in her 75s, grandfather had stomach cancer in his 65s and died in his 15s.  Ms. Barnfield is unaware of previous family history of genetic testing for hereditary cancer risks. Patient's maternal ancestors are of unknown descent, and paternal ancestors are of unknown descent. There is no reported Ashkenazi Jewish ancestry. There is no known consanguinity.    GENETIC COUNSELING ASSESSMENT: Tanya Vaughn is a 50 y.o. female with a family history of breast cancer at a young age which is somewhat suggestive of a hereditary cancer syndrome and predisposition to cancer. We, therefore, discussed and recommended the following at today's visit.   DISCUSSION: We discussed that approximately 10% of breast cancer is hereditary. Most cases of  hereditary breast cancer are associated with BRCA1/BRCA2 genes, although there are other genes associated with hereditary cancer as well including. Cancers and risks are gene specific.  We discussed that testing is beneficial for several reasons including  knowing about cancer risks, identifying potential screening and risk-reduction options that may be appropriate, and to understand if other family members could be at risk for cancer and allow them to undergo genetic testing.   We reviewed the characteristics, features and inheritance patterns of hereditary cancer syndromes. We also discussed genetic testing, including the appropriate family members to test, the process of testing, insurance coverage and turn-around-time for results. We discussed the implications of a negative, positive and/or variant of uncertain significant result. We recommended Tanya Vaughn pursue genetic testing for the [facility] Multi-Cancer+RNA gene panel.   The Multi-Cancer Panel + RNA offered by [facility] includes sequencing and/or deletion duplication testing of the following 84 genes: AIP, ALK, APC, ATM, AXIN2,BAP1,  BARD1, BLM, BMPR1A, BRCA1, BRCA2, BRIP1, CASR, CDC73, CDH1, CDK4, CDKN1B, CDKN1C, CDKN2A (p14ARF), CDKN2A (p16INK4a), CEBPA, CHEK2, CTNNA1, DICER1, DIS3L2, EGFR (c.2369C>T, p.Thr790Met variant only), EPCAM (Deletion/duplication testing only), FH, FLCN, GATA2, GPC3, GREM1 (Promoter region deletion/duplication testing only), HOXB13 (c.251G>A, p.Gly84Glu), HRAS, KIT, MAX, MEN1, MET, MITF (c.952G>A, p.Glu318Lys variant only), MLH1, MSH2, MSH3, MSH6, MUTYH, NBN, NF1, NF2, NTHL1, PALB2, PDGFRA, PHOX2B, PMS2, POLD1, POLE, POT1, PRKAR1A, PTCH1, PTEN, RAD50, RAD51C, RAD51D, RB1, RECQL4, RET, RUNX1, SDHAF2, SDHA (sequence changes only), SDHB, SDHC, SDHD, SMAD4, SMARCA4, SMARCB1, SMARCE1, STK11, SUFU, TERC, TERT, TMEM127, TP53, TSC1, TSC2, VHL, WRN and WT1.  Based on Tanya Vaughn's family history of cancer, she meets medical  criteria for genetic testing. Despite that she meets criteria, she may still have an out of pocket cost. We discussed that if her out of pocket cost for testing is over $100, the laboratory will call and confirm whether she wants to proceed with testing.  If the out of pocket cost of testing is less than $100 she will be billed by the genetic testing laboratory.   PLAN: After considering the risks, benefits, and limitations, Tanya Vaughn did not wish to pursue genetic testing at today's visit. She'd like to discuss this with her mother first and see if she has had testing or would like to get testing. We understand this decision and remain available to coordinate genetic testing at any time in the future. We, therefore, recommend Tanya Vaughn continue to follow the cancer screening guidelines given by her primary healthcare provider.  Tanya Vaughn questions were answered to her satisfaction today. Our contact information was provided should additional questions or concerns arise. Thank you for the referral and allowing [place] to share in the care of your patient.   Faith Rogue, MS, [facility] Genetic Counselor [email or web address] Phone: [phone]  The patient was seen for  a total of 20 minutes in face-to-face genetic counseling.  Patient was seen alone. Drs. Magrinat/Gudena and/or Burr Medico were available for discussion regarding this case.   _______________________________________________________________________ For Office Staff:  Number of people involved in session: 1 Was an Intern/ student involved with case: no

## 2021-07-28 ENCOUNTER — Other Ambulatory Visit: Payer: Self-pay

## 2021-07-28 ENCOUNTER — Other Ambulatory Visit: Payer: Self-pay | Admitting: Gastroenterology

## 2021-07-28 ENCOUNTER — Ambulatory Visit (AMBULATORY_SURGERY_CENTER): Payer: 59

## 2021-07-28 VITALS — Ht 66.5 in | Wt 153.0 lb

## 2021-07-28 DIAGNOSIS — Z1211 Encounter for screening for malignant neoplasm of colon: Secondary | ICD-10-CM

## 2021-07-28 HISTORY — PX: COLONOSCOPY: SHX174

## 2021-07-28 MED ORDER — NA SULFATE-K SULFATE-MG SULF 17.5-3.13-1.6 GM/177ML PO SOLN: 1.0000 | Freq: Once | ORAL | 0 refills | Status: AC

## 2021-07-28 NOTE — Progress Notes (Signed)
Pre visit completed via phone; Patient verified name, DOB, and address; No egg or soy allergy known to patient  No issues known to pt with past sedation with any surgeries or procedures Patient denies ever being told they had issues or difficulty with intubation  No FH of Malignant Hyperthermia Pt is not on diet pills Pt is not on home 02  Pt is not on blood thinners  Pt denies issues with constipation at this time;  No A fib or A flutter Pt is fully vaccinated for Covid x 2; Coupon given to pt in PV today, Code to Pharmacy and NO PA's for preps discussed with pt in PV today  Discussed with pt there will be an out-of-pocket cost for prep and that varies from $0 to 70 +  dollars - pt verbalized understanding  Due to the COVID-19 pandemic we are asking patients to follow certain guidelines in PV and the [place]   Pt aware of COVID protocols and [facility] guidelines

## 2021-07-30 ENCOUNTER — Other Ambulatory Visit: Payer: Self-pay

## 2021-07-30 ENCOUNTER — Ambulatory Visit (HOSPITAL_BASED_OUTPATIENT_CLINIC_OR_DEPARTMENT_OTHER)
Admission: RE | Admit: 2021-07-30 | Discharge: 2021-07-30 | Disposition: A | Discharge status: Home / Self Care | Payer: 59 | Source: Ambulatory Visit | Attending: Obstetrics & Gynecology | Admitting: Obstetrics & Gynecology

## 2021-07-30 DIAGNOSIS — Z803 Family history of malignant neoplasm of breast: Secondary | ICD-10-CM | POA: Insufficient documentation

## 2021-07-30 DIAGNOSIS — Z1231 Encounter for screening mammogram for malignant neoplasm of breast: Secondary | ICD-10-CM | POA: Insufficient documentation

## 2021-07-31 ENCOUNTER — Other Ambulatory Visit: Payer: Self-pay

## 2021-07-31 MED ORDER — PEG 3350-KCL-NA BICARB-NACL 420 G PO SOLR: 4000.0000 mL | Freq: Once | ORAL | 0 refills | Status: AC

## 2021-07-31 NOTE — Progress Notes (Unsigned)
Called and spoke with pharmacy-Suprep not covered by patient's insurance- prep changed to [facility]; RX sent in via Epic; Suprep RX cancelled verbally with pharmacist; revised instructions sent to patient via [place] as PV RN was unable to reach patient-PV RN left message for the patient to call back to the office to receive instructions verbally;

## 2021-07-31 NOTE — Telephone Encounter (Signed)
Attempted to reach patient- message left for the patient to call back to the office concerning revised instructions and new Rx that has been sent to the pharmacy due to non-coverage of prep for the patient; revised instructions sent via MyChart to the patient;

## 2021-08-05 ENCOUNTER — Encounter: Payer: Self-pay | Admitting: Gastroenterology

## 2021-08-17 ENCOUNTER — Encounter: Payer: Self-pay | Admitting: Gastroenterology

## 2021-08-17 ENCOUNTER — Ambulatory Visit (AMBULATORY_SURGERY_CENTER): Payer: 59 | Admitting: Gastroenterology

## 2021-08-17 ENCOUNTER — Other Ambulatory Visit: Payer: Self-pay | Admitting: Gastroenterology

## 2021-08-17 VITALS — BP 110/71 | HR 53 | Temp 98.4°F | Resp 14 | Ht 66.0 in | Wt 153.0 lb

## 2021-08-17 DIAGNOSIS — K635 Polyp of colon: Secondary | ICD-10-CM | POA: Diagnosis not present

## 2021-08-17 DIAGNOSIS — Z1211 Encounter for screening for malignant neoplasm of colon: Secondary | ICD-10-CM

## 2021-08-17 DIAGNOSIS — D122 Benign neoplasm of ascending colon: Secondary | ICD-10-CM

## 2021-08-17 MED ORDER — SODIUM CHLORIDE 0.9 % IV SOLN: 500.0000 mL | Freq: Once | INTRAVENOUS | Status: DC

## 2021-08-17 NOTE — Progress Notes (Signed)
History and Physical:  This patient presents for endoscopic testing for: Encounter Diagnosis  Name Primary?   Colon cancer screening Yes    Patient denies chronic abdominal pain, rectal bleeding, constipation or diarrhea. !st screening exam  ROS: Patient denies chest pain or cough   Past Medical History: Past Medical History:  Diagnosis Date   Asthma    uses inhaler PRN   Family history of breast cancer    Family history of stomach cancer    paternal grandfather     Past Surgical History: Past Surgical History:  Procedure Laterality Date   WISDOM TOOTH EXTRACTION  2000    Allergies: No Known Allergies  Outpatient Meds: Current Outpatient Medications  Medication Sig Dispense Refill   montelukast (SINGULAIR) 10 MG tablet Take 10 mg by mouth every evening.  5   albuterol (VENTOLIN HFA) 108 (90 Base) MCG/ACT inhaler 2 PUFFS INHALATION EVERY 4 TO 6 HOURS AS NEEDED FOR COUGH/WHEEZE 90 DAYS     fluticasone furoate-vilanterol (BREO ELLIPTA) 100-25 MCG/INH AEPB Inhale 1 puff into the lungs daily. 60 each 6   Multiple Vitamin (MULTIVITAMIN) tablet Take 1 tablet by mouth daily.     VITAMIN D PO Take 1 tablet by mouth every other day.     Current Facility-Administered Medications  Medication Dose Route Frequency Provider Last Rate Last Admin   0.9 %  sodium chloride infusion  500 mL Intravenous Once Doran Stabler, MD          ___________________________________________________________________ Objective   Exam:  BP 116/85   Pulse 72   Temp 98.4 F (36.9 C)   Ht 5\' 6"  (1.676 m)   Wt 153 lb (69.4 kg)   SpO2 100%   BMI 24.69 kg/m   CV: RRR without murmur, S1/S2 Resp: clear to auscultation bilaterally, normal RR and effort noted GI: soft, no tenderness, with active bowel sounds.   Assessment: Encounter Diagnosis  Name Primary?   Colon cancer screening Yes     Plan: Colonoscopy  The benefits and risks of the planned procedure were described in detail  with the patient or (when appropriate) their health care proxy.  Risks were outlined as including, but not limited to, bleeding, infection, perforation, adverse medication reaction leading to cardiac or pulmonary decompensation, pancreatitis (if ERCP).  The limitation of incomplete mucosal visualization was also discussed.  No guarantees or warranties were given.    The patient is appropriate for an endoscopic procedure in the ambulatory setting.   - Wilfrid Lund, MD

## 2021-08-17 NOTE — Progress Notes (Signed)
Report to PACU, RN, vss, BBS= Clear.  

## 2021-08-17 NOTE — Progress Notes (Signed)
VS by DT    

## 2021-08-17 NOTE — Patient Instructions (Signed)
Thank you for allowing [place] to care for you today! Await final biopsy result of polyp removed, approximately 7-10 days. Recommendation for future colonoscopy will be made once results are final. Resume previous diet and medications today, return to normal daily activities tomorrow.     YOU HAD AN ENDOSCOPIC PROCEDURE TODAY AT [facility]:   Refer to the procedure report that was given to you for any specific questions about what was found during the examination.  If the procedure report does not answer your questions, please call your gastroenterologist to clarify.  If you requested that your care partner not be given the details of your procedure findings, then the procedure report has been included in a sealed envelope for you to review at your convenience later.  YOU SHOULD EXPECT: Some feelings of bloating in the abdomen. Passage of more gas than usual.  Walking can help get rid of the air that was put into your GI tract during the procedure and reduce the bloating. If you had a lower endoscopy (such as a colonoscopy or flexible sigmoidoscopy) you may notice spotting of blood in your stool or on the toilet paper. If you underwent a bowel prep for your procedure, you may not have a normal bowel movement for a few days.  Please Note:  You might notice some irritation and congestion in your nose or some drainage.  This is from the oxygen used during your procedure.  There is no need for concern and it should clear up in a day or so.  SYMPTOMS TO REPORT IMMEDIATELY:  Following lower endoscopy (colonoscopy or flexible sigmoidoscopy):  Excessive amounts of blood in the stool  Significant tenderness or worsening of abdominal pains  Swelling of the abdomen that is new, acute  Fever of 100F or higher   For urgent or emergent issues, a gastroenterologist can be reached at any hour by calling [phone]. Do not use MyChart messaging for urgent concerns.    DIET:  We do  recommend a small meal at first, but then you may proceed to your regular diet.  Drink plenty of fluids but you should avoid alcoholic beverages for 24 hours.  ACTIVITY:  You should plan to take it easy for the rest of today and you should NOT DRIVE or use heavy machinery until tomorrow (because of the sedation medicines used during the test).    FOLLOW UP: Our staff will call the number listed on your records 48-72 hours following your procedure to check on you and address any questions or concerns that you may have regarding the information given to you following your procedure. If we do not reach you, we will leave a message.  We will attempt to reach you two times.  During this call, we will ask if you have developed any symptoms of COVID 19. If you develop any symptoms (ie: fever, flu-like symptoms, shortness of breath, cough etc.) before then, please call [phone].  If you test positive for Covid 19 in the 2 weeks post procedure, please call and report this information to [place].    If any biopsies were taken you will be contacted by phone or by letter within the next 1-3 weeks.  Please call us at [phone] if you have not heard about the biopsies in 3 weeks.    SIGNATURES/CONFIDENTIALITY: You and/or your care partner have signed paperwork which will be entered into your electronic medical record.  These signatures attest to the fact that that the information  above on your After Visit Summary has been reviewed and is understood.  Full responsibility of the confidentiality of this discharge information lies with you and/or your care-partner.  

## 2021-08-17 NOTE — Progress Notes (Signed)
Called to room to assist during endoscopic procedure.  Patient ID and intended procedure confirmed with present staff. Received instructions for my participation in the procedure from the performing physician.  

## 2021-08-17 NOTE — Op Note (Signed)
[facility] Patient Name: Tanya Vaughn Procedure Date: 08/17/2021 1:18 PM MRN: [id number] Endoscopist: Mallie Mussel L. Loletha Carrow , MD Age: 50 Referring MD:  Date of Birth: 1971-09-26 Gender: Female Account #: [id number] Procedure:                Colonoscopy Indications:              Screening for colorectal malignant neoplasm, This                            is the patient's first colonoscopy Medicines:                Monitored Anesthesia Care Procedure:                Pre-Anesthesia Assessment:                           - Prior to the procedure, a History and Physical                            was performed, and patient medications and                            allergies were reviewed. The patient's tolerance of                            previous anesthesia was also reviewed. The risks                            and benefits of the procedure and the sedation                            options and risks were discussed with the patient.                            All questions were answered, and informed consent                            was obtained. Prior Anticoagulants: The patient has                            taken no previous anticoagulant or antiplatelet                            agents. ASA Grade Assessment: II - A patient with                            mild systemic disease. After reviewing the risks                            and benefits, the patient was deemed in                            satisfactory condition to undergo the procedure.  After obtaining informed consent, the colonoscope                            was passed under direct vision. Throughout the                            procedure, the patient's blood pressure, pulse, and                            oxygen saturations were monitored continuously. The                            CF [id number] #[id number] was introduced through the anus                            and advanced to the  the cecum, identified by                            appendiceal orifice and ileocecal valve. The                            colonoscopy was extremely difficult due to a                            redundant colon and significant looping. Successful                            completion of the procedure was aided by changing                            the patient's position, using manual pressure and                            straightening and shortening the scope to obtain                            bowel loop reduction. The patient tolerated the                            procedure well. The quality of the bowel                            preparation was good. The ileocecal valve,                            appendiceal orifice, and rectum were photographed.                            The bowel preparation used was SUPREP. Scope In: 1:24:02 PM Scope Out: 1:57:08 PM Scope Withdrawal Time: 0 hours 14 minutes 46 seconds  Total Procedure Duration: 0 hours 33 minutes 6 seconds  Findings:                 The perianal and digital rectal examinations were  normal.                           A 6-8 mm polyp was found in the ascending colon.                            The polyp was semi-sessile. The polyp was removed                            with a cold snare. Resection and retrieval were                            complete.                           The exam was otherwise without abnormality on                            direct and retroflexion views. Complications:            No immediate complications. Estimated Blood Loss:     Estimated blood loss was minimal. Impression:               - One 6-8 mm polyp in the ascending colon, removed                            with a cold snare. Resected and retrieved.                           - The examination was otherwise normal on direct                            and retroflexion views. Recommendation:           - Patient has  a contact number available for                            emergencies. The signs and symptoms of potential                            delayed complications were discussed with the                            patient. Return to normal activities tomorrow.                            Written discharge instructions were provided to the                            patient.                           - Resume previous diet.                           - Continue present medications.                           -  Await pathology results.                           - Repeat colonoscopy is recommended for                            surveillance. The colonoscopy date will be                            determined after pathology results from today's                            exam become available for review. Tavarious Freel L. Loletha Carrow, MD 08/17/2021 2:01:08 PM This report has been signed electronically.

## 2021-08-19 ENCOUNTER — Telehealth: Payer: Self-pay

## 2021-08-19 NOTE — Telephone Encounter (Signed)
Attempted f/u call. No answer, left VM. 

## 2021-08-25 ENCOUNTER — Encounter: Payer: Self-pay | Admitting: Gastroenterology

## 2021-10-02 ENCOUNTER — Other Ambulatory Visit: Payer: Self-pay

## 2021-10-02 ENCOUNTER — Telehealth: Payer: 59 | Admitting: Family Medicine

## 2021-10-02 ENCOUNTER — Encounter: Payer: Self-pay | Admitting: Family Medicine

## 2021-10-02 VITALS — Temp 97.4°F | Wt 153.0 lb

## 2021-10-02 DIAGNOSIS — U071 COVID-19: Secondary | ICD-10-CM | POA: Diagnosis not present

## 2021-10-02 DIAGNOSIS — J4520 Mild intermittent asthma, uncomplicated: Secondary | ICD-10-CM

## 2021-10-02 DIAGNOSIS — J309 Allergic rhinitis, unspecified: Secondary | ICD-10-CM | POA: Diagnosis not present

## 2021-10-02 NOTE — Progress Notes (Signed)
° °  Subjective:    Patient ID: Tanya Vaughn, female    DOB: [birth year], 51 y.o.   MRN: [id number]  HPI She has been exposed to [place] and has been testing fairly regularly for this.  Yesterday she had difficulty with myalgias, fatigue and runny nose.  She then tested positive.  She has had 3 vaccines.  Today her symptoms are unchanged.  She does have underlying allergies and asthma.   Review of Systems     Objective:   Physical Exam Alert and in no distress otherwise not examined       Assessment & Plan:  COVID-19  Mild intermittent asthma without complication  Allergic rhinitis, unspecified seasonality, unspecified trigger Explained that we have a 5-day window to monitor her situation and symptoms of fever, worsening cough and shortness of breath would require more aggressive therapy especially with underlying asthma.  Recommend Tylenol for aches and pains and continue on her asthma medications.  She will keep in touch with me.  After Tuesday if she is improving she should then wear a mask for another 5 days.  She expressed understanding of this.

## 2021-12-21 ENCOUNTER — Telehealth: Payer: Self-pay | Admitting: Pulmonary Disease

## 2021-12-21 MED ORDER — PREDNISONE 10 MG PO TABS: ORAL_TABLET | ORAL | 0 refills | Status: AC

## 2021-12-21 NOTE — Telephone Encounter (Signed)
Hx of allergic asthma. ? ?Has cough and wheeze.  No fever.  Went to urgent care.  Prescribed zithromax, but no improvement.  Still has wheezing.  Will send script for prednisone taper.  Continue breo, singulair, and prn albuterol. ?

## 2021-12-23 ENCOUNTER — Telehealth: Payer: Self-pay
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
## 2021-12-23 NOTE — Telephone Encounter (Signed)
-----   Message from Chesley Mires, MD sent at 12/23/2021  1:04 PM EDT ----- ?Can you call Mrs. Cooter to get a follow up with me in [place] in next few weeks.  Okay to double book visit if needed. ? ?Thanks. ? ?V ? ?
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
## 2021-12-25 NOTE — Telephone Encounter (Signed)
Letter sent per protocol after 3 attempts to reach pt ?
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
## 2022-01-29 ENCOUNTER — Ambulatory Visit: Payer: 59 | Admitting: Pulmonary Disease

## 2022-01-29 ENCOUNTER — Encounter: Payer: Self-pay | Admitting: Pulmonary Disease

## 2022-01-29 VITALS — BP 118/72 | HR 66 | Ht 66.0 in | Wt 151.0 lb

## 2022-01-29 DIAGNOSIS — J8283 Eosinophilic asthma: Secondary | ICD-10-CM

## 2022-01-29 DIAGNOSIS — J4550 Severe persistent asthma, uncomplicated: Secondary | ICD-10-CM

## 2022-01-29 DIAGNOSIS — J301 Allergic rhinitis due to pollen: Secondary | ICD-10-CM

## 2022-01-29 LAB — NITRIC OXIDE: Nitric Oxide: 121

## 2022-01-29 MED ORDER — TRELEGY ELLIPTA 200-62.5-25 MCG/ACT IN AEPB: 1.0000 | INHALATION_SPRAY | Freq: Every day | RESPIRATORY_TRACT | 5 refills | Status: DC

## 2022-01-29 MED ORDER — FLUTICASONE PROPIONATE 50 MCG/ACT NA SUSP: 1.0000 | Freq: Every day | NASAL | 5 refills | Status: DC

## 2022-01-29 MED ORDER — AZELASTINE HCL 0.15 % NA SOLN: 1.0000 | Freq: Two times a day (BID) | NASAL | 5 refills | Status: DC

## 2022-01-29 MED ORDER — MONTELUKAST SODIUM 10 MG PO TABS: 10.0000 mg | ORAL_TABLET | Freq: Every evening | ORAL | 5 refills | Status: DC

## 2022-01-29 MED ORDER — TRELEGY ELLIPTA 200-62.5-25 MCG/ACT IN AEPB: 1.0000 | INHALATION_SPRAY | Freq: Every day | RESPIRATORY_TRACT | 0 refills | Status: DC

## 2022-01-29 NOTE — Addendum Note (Signed)
Addended by: Valerie Salts on: 01/29/2022 11:41 AM ? ? Modules accepted: Orders ? ?

## 2022-01-29 NOTE — Patient Instructions (Signed)
Treley one puff daily, and rinse your mouth after each use. ? ?Astepro 1 spray in each nostril in the morning and in the evening. ? ?Flonase 1 spray in each nostril before going to bed. ? ?Continue singulair 10 mg pill nightly. ? ?Albuterol 2 puffs every 6 hours as needed for cough, wheeze, chest congestion, or shortness of breath.  You can also use this as needed prior to exercising. ? ?Stop using breo once you start using trelegy. ? ?Follow up in 6 weeks ? ? ?

## 2022-01-29 NOTE — Progress Notes (Signed)
? ?[facility] ? ?Chief Complaint  ?Patient presents with  ? Follow-up  ?  F/U on asthma. States she developed a URI back in March and hasn't recovered yet. Increased SOB and wheezing.   ? ? ?Past Surgical History:  ?She  has a past surgical history that includes Wisdom tooth extraction (2000). ? ?Past Medical History:  ?COVID 15 November 2020, Colon polyp ? ?Constitutional:  ?BP 118/72   Pulse 66   Ht '5\' 6"'$  (1.676 m)   Wt 151 lb (68.5 kg)   SpO2 98% Comment: on RA  BMI 24.37 kg/m?  ? ?Brief Summary:  ?Tanya Vaughn is a 51 y.o. female with allergic asthma. ?  ? ? ? ?Subjective:  ? ?CBC from 03/13/21 showed eosinophil at 800. ? ?She called office in March.  Script for prednisone sent.  This helped, but she hasn't fully recovered.  She is having more sinus congestion and post nasal drip.  She has trouble with exercising.  She gets coughing spells that last for about 30 minutes after she stops exercising.  She feels tight in her chest and has some wheezing when this happens.  This is now starting to happen even when she is just walking he dog.  She wakes up at night with sneezing and coughing spells. ? ?Physical Exam:  ? ?Appearance - well kempt  ? ?ENMT - no sinus tenderness, no oral exudate, no LAN, Mallampati 2 airway, no stridor, clear nasal drainage ? ?Respiratory - scattered rhonchi, no wheezing ? ?CV - s1s2 regular rate and rhythm, no murmurs ? ?Ext - no clubbing, no edema ? ?Skin - no rashes ? ?Psych - normal mood and affect ?  ?Pulmonary testing:  ?FeNO 12/03/15 >> 232 ?Spirometry 12/30/15 >> FEV1 2.98 (99%), FEV1% 75 ?FeNO 12/30/15 >> 39 ?FeNO 01/29/22 >> 121 ? ?Chest Imaging:  ? ? ?Social History:  ?She  reports that she has never smoked. She has never used smokeless tobacco. She reports current alcohol use. She reports that she does not use drugs. ? ?Family History:  ?Her family history includes Alzheimer's disease in her father; Breast cancer (age of onset:  18) in her mother; Emphysema in her maternal grandmother; Stomach cancer (age of onset: 57) in her paternal grandfather. ?  ? ? ?Assessment/Plan:  ? ?Allergic asthma with eosinophilic phenotype. ?- progressed since she had a viral respiratory infection in March 2023 ?- still has increased FeNO and previous CBC showed elevated eosinophil count ?- will change to trelegy 200 one puff daily in place of breo 100; samples provided ?- continue singulair 10 mg qhs ?- prn albuterol ?- if she continues to have difficulty, then she would need assessment for biologic agent and would need additional testing (CBC with diff, RAST with IgE, CXR, PFT) ? ?Exercise induced asthma. ?- brief, high intensity warm up prior to exercising ?- prn albuterol prior to exercising ? ?Allergic rhinitis. ?- add flonase and astepro ?- continue singulair ? ?Dust mite allergy. ?- followed by Dr. Tiajuana Amass with [facility] ? ?Time Spent Involved in Patient Care on Day of Examination:  ?36 minutes ? ?Follow up:  ? ?Patient Instructions  ?Treley one puff daily, and rinse your mouth after each use. ? ?Astepro 1 spray in each nostril in the morning and in the evening. ? ?Flonase 1 spray in each nostril before going to bed. ? ?Continue singulair 10 mg pill nightly. ? ?Albuterol 2 puffs every 6 hours as needed for cough,  wheeze, chest congestion, or shortness of breath.  You can also use this as needed prior to exercising. ? ?Stop using breo once you start using trelegy. ? ?Follow up in 6 weeks ? ? ? ?Medication List:  ? ?Allergies as of 01/29/2022   ?No Known Allergies ?  ? ?  ?Medication List  ?  ? ?  ? Accurate as of Jan 29, 2022 10:58 AM. If you have any questions, ask your nurse or doctor.  ?  ?  ? ?  ? ?STOP taking these medications   ? ?fluticasone furoate-vilanterol 100-25 MCG/INH Aepb ?Commonly known as: Breo Ellipta ?Stopped by: Chesley Mires, MD ?  ? ?  ? ?TAKE these medications   ? ?albuterol 108 (90 Base) MCG/ACT inhaler ?Commonly known as:  VENTOLIN HFA ?2 PUFFS INHALATION EVERY 4 TO 6 HOURS AS NEEDED FOR COUGH/WHEEZE 90 DAYS ?  ?Azelastine HCl 0.15 % Soln ?Commonly known as: Astepro ?Place 1 spray into the nose 2 (two) times daily. ?Started by: Chesley Mires, MD ?  ?fluticasone 50 MCG/ACT nasal spray ?Commonly known as: FLONASE ?Place 1 spray into both nostrils daily. ?Started by: Chesley Mires, MD ?  ?montelukast 10 MG tablet ?Commonly known as: SINGULAIR ?Take 1 tablet (10 mg total) by mouth every evening. ?  ?multivitamin tablet ?Take 1 tablet by mouth daily. ?  ?Trelegy Ellipta 200-62.5-25 MCG/ACT Aepb ?Generic drug: Fluticasone-Umeclidin-Vilant ?Inhale 1 puff into the lungs daily in the afternoon. ?Started by: Chesley Mires, MD ?  ?VITAMIN D PO ?Take 1 tablet by mouth every other day. ?  ? ?  ? ? ?Signature:  ?Chesley Mires, MD ?[place] ?Pager - [phone] ?01/29/2022, 10:58 AM ?  ? ? ? ? ? ? ? ? ?

## 2022-03-17 ENCOUNTER — Ambulatory Visit: Payer: 59 | Admitting: Pulmonary Disease

## 2022-04-12 ENCOUNTER — Encounter: Payer: Self-pay | Admitting: Pulmonary Disease

## 2022-04-12 ENCOUNTER — Ambulatory Visit (INDEPENDENT_AMBULATORY_CARE_PROVIDER_SITE_OTHER): Payer: 59 | Admitting: Pulmonary Disease

## 2022-04-12 VITALS — BP 118/70 | HR 65 | Ht 66.0 in | Wt 157.4 lb

## 2022-04-12 DIAGNOSIS — J8283 Eosinophilic asthma: Secondary | ICD-10-CM | POA: Diagnosis not present

## 2022-04-12 DIAGNOSIS — J4550 Severe persistent asthma, uncomplicated: Secondary | ICD-10-CM | POA: Diagnosis not present

## 2022-04-12 DIAGNOSIS — J301 Allergic rhinitis due to pollen: Secondary | ICD-10-CM | POA: Diagnosis not present

## 2022-04-12 MED ORDER — TRELEGY ELLIPTA 100-62.5-25 MCG/ACT IN AEPB: 1.0000 | INHALATION_SPRAY | Freq: Every day | RESPIRATORY_TRACT | 3 refills | Status: DC
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
## 2022-04-12 NOTE — Patient Instructions (Signed)
Will change trelegy to lower dose and have it sent as 90 day supply through mail order pharmacy  Follow up in 6 months

## 2022-04-26 ENCOUNTER — Encounter: Payer: Self-pay | Admitting: Medical

## 2022-04-26 ENCOUNTER — Ambulatory Visit (INDEPENDENT_AMBULATORY_CARE_PROVIDER_SITE_OTHER): Payer: 59 | Admitting: Medical

## 2022-04-26 VITALS — BP 122/68 | HR 56 | Ht 66.5 in | Wt 156.0 lb

## 2022-04-26 DIAGNOSIS — Z0000 Encounter for general adult medical examination without abnormal findings: Secondary | ICD-10-CM | POA: Diagnosis not present

## 2022-04-26 DIAGNOSIS — Z136 Encounter for screening for cardiovascular disorders: Secondary | ICD-10-CM

## 2022-04-26 DIAGNOSIS — Z13220 Encounter for screening for lipoid disorders: Secondary | ICD-10-CM

## 2022-04-26 DIAGNOSIS — Z7185 Encounter for immunization safety counseling: Secondary | ICD-10-CM | POA: Diagnosis not present

## 2022-04-26 DIAGNOSIS — D171 Benign lipomatous neoplasm of skin and subcutaneous tissue of trunk: Secondary | ICD-10-CM | POA: Insufficient documentation

## 2022-04-26 LAB — POCT URINALYSIS DIP (PROADVANTAGE DEVICE)
Bilirubin, UA: NEGATIVE
Blood, UA: NEGATIVE
Glucose, UA: NEGATIVE mg/dL
Ketones, POC UA: NEGATIVE mg/dL
Leukocytes, UA: NEGATIVE
Nitrite, UA: NEGATIVE
Protein Ur, POC: NEGATIVE mg/dL
Specific Gravity, Urine: 1.01
Urobilinogen, Ur: NEGATIVE
pH, UA: 6.5 (ref 5.0–8.0)

## 2022-04-26 NOTE — Patient Instructions (Signed)
This visit was a preventative care visit, also known as wellness visit or routine physical.   Topics typically include healthy lifestyle, diet, exercise, preventative care, vaccinations, sick and well care, proper use of emergency dept and after hours care, as well as other concerns.     Recommendations: Continue to return yearly for your annual wellness and preventative care visits.  This gives [place] a chance to discuss healthy lifestyle, exercise, vaccinations, review your chart record, and perform screenings where appropriate.  I recommend you see your eye doctor yearly for routine vision care.  I recommend you see your dentist yearly for routine dental care including hygiene visits twice yearly.   Vaccination recommendations were reviewed Immunization History  Administered Date(s) Administered   Influenza Inj Mdck Quad Pf 07/04/2018   Influenza, High Dose Seasonal PF 08/16/2016, 10/25/2017, 10/26/2018, 07/29/2020   Influenza,inj,Quad PF,6+ Mos 07/29/2015, 07/08/2021   PFIZER(Purple Top)SARS-COV-2 Vaccination 12/21/2019, 01/11/2020, 09/11/2020   Tdap 03/13/2021    Consider pneumococcal 23 vaccine  Shingles vaccine:  I recommend you have a shingles vaccine to help prevent shingles or herpes zoster outbreak.   Please call your insurer to inquire about coverage for the Shingrix vaccine given in 2 doses.   Some insurers cover this vaccine after age 41, some cover this after age 28.  If your insurer covers this, then call to schedule appointment to have this vaccine here.  I recommend a yearly flu shot in the fall   Screening for cancer: Colon cancer screening: I reviewed your colonoscopy on file that is up to date from 2022, due repeat in 2027  Breast cancer screening: You should perform a self breast exam monthly.   2022 mammogram reviewed  Cervical cancer screening: 2022 pap reviewed   Skin cancer screening: Check your skin regularly for new changes, growing lesions, or other  lesions of concern Come in for evaluation if you have skin lesions of concern.  Lung cancer screening: If you have a greater than 20 pack year history of tobacco use, then you may qualify for lung cancer screening with a chest CT scan.   Please call your insurance company to inquire about coverage for this test.  We currently don't have screenings for other cancers besides breast, cervical, colon, and lung cancers.  If you have a strong family history of cancer or have other cancer screening concerns, please let me know.    Bone health: Get at least 150 minutes of aerobic exercise weekly Get weight bearing exercise at least once weekly Bone density test:  A bone density test is an imaging test that uses a type of X-ray to measure the amount of calcium and other minerals in your bones. The test may be used to diagnose or screen you for a condition that causes weak or thin bones (osteoporosis), predict your risk for a broken bone (fracture), or determine how well your osteoporosis treatment is working. The bone density test is recommended for females 62 and older, or females or males <29 if certain risk factors such as thyroid disease, long term use of steroids such as for asthma or rheumatological issues, vitamin D deficiency, estrogen deficiency, family history of osteoporosis, self or family history of fragility fracture in first degree relative.    Heart health: Get at least 150 minutes of aerobic exercise weekly Limit alcohol It is important to maintain a healthy blood pressure and healthy cholesterol numbers  Heart disease screening: Screening for heart disease includes screening for blood pressure, fasting lipids, glucose/diabetes screening,  BMI height to weight ratio, reviewed of smoking status, physical activity, and diet.    Goals include blood pressure 120/80 or less, maintaining a healthy lipid/cholesterol profile, preventing diabetes or keeping diabetes numbers under good  control, not smoking or using tobacco products, exercising most days per week or at least 150 minutes per week of exercise, and eating healthy variety of fruits and vegetables, healthy oils, and avoiding unhealthy food choices like fried food, fast food, high sugar and high cholesterol foods.    Other tests may possibly include EKG test, CT coronary calcium score, echocardiogram, exercise treadmill stress test.    Medical care options: I recommend you continue to seek care here first for routine care.  We try really hard to have available appointments Monday through Friday daytime hours for sick visits, acute visits, and physicals.  Urgent care should be used for after hours and weekends for significant issues that cannot wait till the next day.  The emergency department should be used for significant potentially life-threatening emergencies.  The emergency department is expensive, can often have long wait times for less significant concerns, so try to utilize primary care, urgent care, or telemedicine when possible to avoid unnecessary trips to the emergency department.  Virtual visits and telemedicine have been introduced since the pandemic started in 2020, and can be convenient ways to receive medical care.  We offer virtual appointments as well to assist you in a variety of options to seek medical care.   Advanced Directives: I recommend you consider completing a [place] and Living Will.   These documents respect your wishes and help alleviate burdens on your loved ones if you were to become terminally ill or be in a position to need those documents enforced.    You can complete Advanced Directives yourself, have them notarized, then have copies made for our office, for you and for anybody you feel should have them in safe keeping.  Or, you can have an attorney prepare these documents.   If you haven't updated your Last Will and Testament in a while, it may be worthwhile  having an attorney prepare these documents together and save on some costs.       Separate significant issues discussed: Asthma - managed by pulmonology  Lipoma - consider general surgery consult . Let me know if you want to pursue this

## 2022-04-26 NOTE — Progress Notes (Signed)
Subjective:   HPI  Tanya Vaughn is a 51 y.o. female who presents for Chief Complaint  Patient presents with   fasting cpe    Fasting cpe, lump on back that appeared 2 months ago, no pain. Has obgyn in october    Patient Care Team: Breeze Angell, Camelia Eng, PA-C as PCP - General (Family Medicine), prior here with Harland Dingwall, NP Dr. Chesley Mires, pulmonology  Dr. Wilfrid Lund, GI Dr. Rutherford Guys, ophthalmology Dr. Hale Bogus, gyn Dr. Stefanie Libel, sports medicine   Concerns: Her 15yo son noticed a lump of her upper left back recently. She thinks its been there less than a year.  No pain, no swelling, no discoloration.  Thinks its a lipoma.    Has asthma, was recently started on Trelegy as Breo wasn't getting her to improvement.   Doing ok so far on Trelegy better than Breo.   Past Medical History:  Diagnosis Date   Allergy    Asthma    Colon polyp    Family history of breast cancer    Family history of stomach cancer    paternal grandfather    Family History  Problem Relation Age of Onset   Breast cancer Mother 58       mastectomy, chemo/radiation   Alzheimer's disease Father    Emphysema Maternal Grandmother    Stomach cancer Paternal Grandfather 92   Colon polyps Neg Hx    Colon cancer Neg Hx    Esophageal cancer Neg Hx    Rectal cancer Neg Hx    Heart disease Neg Hx    Diabetes Neg Hx    Hypertension Neg Hx      Current Outpatient Medications:    albuterol (VENTOLIN HFA) 108 (90 Base) MCG/ACT inhaler, 2 PUFFS INHALATION EVERY 4 TO 6 HOURS AS NEEDED FOR COUGH/WHEEZE 90 DAYS, Disp: , Rfl:    Azelastine HCl (ASTEPRO) 0.15 % SOLN, Place 1 spray into the nose 2 (two) times daily., Disp: 30 mL, Rfl: 5   fluticasone (FLONASE) 50 MCG/ACT nasal spray, Place 1 spray into both nostrils daily., Disp: 16 g, Rfl: 5   Fluticasone-Umeclidin-Vilant (TRELEGY ELLIPTA) 100-62.5-25 MCG/ACT AEPB, Inhale 1 puff into the lungs daily in the afternoon., Disp: 90 each, Rfl: 3    montelukast (SINGULAIR) 10 MG tablet, Take 1 tablet (10 mg total) by mouth every evening., Disp: 30 tablet, Rfl: 5   Multiple Vitamin (MULTIVITAMIN) tablet, Take 1 tablet by mouth daily., Disp: , Rfl:    VITAMIN D PO, Take 1 tablet by mouth every other day., Disp: , Rfl:   Allergies  Allergen Reactions   Other     cats   Reviewed their medical, surgical, family, social, medication, and allergy history and updated chart as appropriate.   Review of Systems Constitutional: -fever, -chills, -sweats, -unexpected weight change, -decreased appetite, -fatigue Allergy: -sneezing, -itching, -congestion Dermatology: -changing moles, --rash, -lumps ENT: -runny nose, -ear pain, -sore throat, -hoarseness, -sinus pain, -teeth pain, - ringing in ears, -hearing loss, -nosebleeds Cardiology: -chest pain, -palpitations, -swelling, -difficulty breathing when lying flat, -waking up short of breath Respiratory: -cough, -shortness of breath, -difficulty breathing with exercise or exertion, -wheezing, -coughing up blood Gastroenterology: -abdominal pain, -nausea, -vomiting, -diarrhea, -constipation, -blood in stool, -changes in bowel movement, -difficulty swallowing or eating Hematology: -bleeding, -bruising  Musculoskeletal: -joint aches, -muscle aches, -joint swelling, -back pain, -neck pain, -cramping, -changes in gait Ophthalmology: denies vision changes, eye redness, itching, discharge Urology: -burning with urination, -difficulty urinating, -blood in urine, -  urinary frequency, -urgency, -incontinence Neurology: -headache, -weakness, -tingling, -numbness, -memory loss, -falls, -dizziness Psychology: -depressed mood, -agitation, -sleep problems Breast/gyn: -breast tendnerss, -discharge, -lumps, -vaginal discharge,- irregular periods, -heavy periods      04/26/2022   10:47 AM 07/08/2021    8:31 AM 03/13/2021    9:28 AM 11/09/2016    2:09 PM  Depression screen PHQ 2/9  Decreased Interest 0 0 0 0  Down,  Depressed, Hopeless 0 0 0 0  PHQ - 2 Score 0 0 0 0       Objective:  BP 122/68   Pulse (!) 56   Ht 5' 6.5" (1.689 m)   Wt 156 lb (70.8 kg)   BMI 24.80 kg/m   General appearance: alert, no distress, WD/WN, Caucasian female Skin: right upper back with raised spongy 7cm diameter mass c/w lipoma, otherwise skin unremarkable HEENT: normocephalic, conjunctiva/corneas normal, sclerae anicteric, PERRLA, EOMi, nares patent, no discharge or erythema, pharynx normal Neck: supple, no lymphadenopathy, no thyromegaly, no masses, normal ROM, no bruits Chest: non tender, normal shape and expansion Heart: RRR, normal S1, S2, no murmurs Lungs: CTA bilaterally, no wheezes, rhonchi, or rales Abdomen: +bs, soft, non tender, non distended, no masses, no hepatomegaly, no splenomegaly, no bruits Back: non tender, normal ROM, no scoliosis Musculoskeletal: upper extremities non tender, no obvious deformity, normal ROM throughout, lower extremities non tender, no obvious deformity, normal ROM throughout Extremities: no edema, no cyanosis, no clubbing Pulses: 2+ symmetric, upper and lower extremities, normal cap refill Neurological: alert, oriented x 3, CN2-12 intact, strength normal upper extremities and lower extremities, sensation normal throughout, DTRs 2+ throughout, no cerebellar signs, gait normal Psychiatric: normal affect, behavior normal, pleasant  Breast/gyn/rectal - deferred to gynecology   EKG reviewed    Assessment and Plan :   Encounter Diagnoses  Name Primary?   Encounter for health maintenance examination in adult Yes   Screening for lipid disorders    Vaccine counseling    Screening for heart disease    Lipoma of torso      This visit was a preventative care visit, also known as wellness visit or routine physical.   Topics typically include healthy lifestyle, diet, exercise, preventative care, vaccinations, sick and well care, proper use of emergency dept and after hours care, as  well as other concerns.     Recommendations: Continue to return yearly for your annual wellness and preventative care visits.  This gives [place] a chance to discuss healthy lifestyle, exercise, vaccinations, review your chart record, and perform screenings where appropriate.  I recommend you see your eye doctor yearly for routine vision care.  I recommend you see your dentist yearly for routine dental care including hygiene visits twice yearly.   Vaccination recommendations were reviewed Immunization History  Administered Date(s) Administered   Influenza Inj Mdck Quad Pf 07/04/2018   Influenza, High Dose Seasonal PF 08/16/2016, 10/25/2017, 10/26/2018, 07/29/2020   Influenza,inj,Quad PF,6+ Mos 07/29/2015, 07/08/2021   PFIZER(Purple Top)SARS-COV-2 Vaccination 12/21/2019, 01/11/2020, 09/11/2020   Tdap 03/13/2021    Consider pneumococcal 23 vaccine  Shingles vaccine:  I recommend you have a shingles vaccine to help prevent shingles or herpes zoster outbreak.   Please call your insurer to inquire about coverage for the Shingrix vaccine given in 2 doses.   Some insurers cover this vaccine after age 60, some cover this after age 45.  If your insurer covers this, then call to schedule appointment to have this vaccine here.  I recommend a yearly flu shot in the  fall   Screening for cancer: Colon cancer screening: I reviewed your colonoscopy on file that is up to date from 2022, due repeat in 2027  Breast cancer screening: You should perform a self breast exam monthly.   2022 mammogram reviewed  Cervical cancer screening: 2022 pap reviewed   Skin cancer screening: Check your skin regularly for new changes, growing lesions, or other lesions of concern Come in for evaluation if you have skin lesions of concern.  Lung cancer screening: If you have a greater than 20 pack year history of tobacco use, then you may qualify for lung cancer screening with a chest CT scan.   Please call your  insurance company to inquire about coverage for this test.  We currently don't have screenings for other cancers besides breast, cervical, colon, and lung cancers.  If you have a strong family history of cancer or have other cancer screening concerns, please let me know.    Bone health: Get at least 150 minutes of aerobic exercise weekly Get weight bearing exercise at least once weekly Bone density test:  A bone density test is an imaging test that uses a type of X-ray to measure the amount of calcium and other minerals in your bones. The test may be used to diagnose or screen you for a condition that causes weak or thin bones (osteoporosis), predict your risk for a broken bone (fracture), or determine how well your osteoporosis treatment is working. The bone density test is recommended for females 21 and older, or females or males <60 if certain risk factors such as thyroid disease, long term use of steroids such as for asthma or rheumatological issues, vitamin D deficiency, estrogen deficiency, family history of osteoporosis, self or family history of fragility fracture in first degree relative.    Heart health: Get at least 150 minutes of aerobic exercise weekly Limit alcohol It is important to maintain a healthy blood pressure and healthy cholesterol numbers  Heart disease screening: Screening for heart disease includes screening for blood pressure, fasting lipids, glucose/diabetes screening, BMI height to weight ratio, reviewed of smoking status, physical activity, and diet.    Goals include blood pressure 120/80 or less, maintaining a healthy lipid/cholesterol profile, preventing diabetes or keeping diabetes numbers under good control, not smoking or using tobacco products, exercising most days per week or at least 150 minutes per week of exercise, and eating healthy variety of fruits and vegetables, healthy oils, and avoiding unhealthy food choices like fried food, fast food, high sugar  and high cholesterol foods.    Other tests may possibly include EKG test, CT coronary calcium score, echocardiogram, exercise treadmill stress test.    Medical care options: I recommend you continue to seek care here first for routine care.  We try really hard to have available appointments Monday through Friday daytime hours for sick visits, acute visits, and physicals.  Urgent care should be used for after hours and weekends for significant issues that cannot wait till the next day.  The emergency department should be used for significant potentially life-threatening emergencies.  The emergency department is expensive, can often have long wait times for less significant concerns, so try to utilize primary care, urgent care, or telemedicine when possible to avoid unnecessary trips to the emergency department.  Virtual visits and telemedicine have been introduced since the pandemic started in 2020, and can be convenient ways to receive medical care.  We offer virtual appointments as well to assist you in a variety of  options to seek medical care.   Advanced Directives: I recommend you consider completing a [place] and Living Will.   These documents respect your wishes and help alleviate burdens on your loved ones if you were to become terminally ill or be in a position to need those documents enforced.    You can complete Advanced Directives yourself, have them notarized, then have copies made for our office, for you and for anybody you feel should have them in safe keeping.  Or, you can have an attorney prepare these documents.   If you haven't updated your Last Will and Testament in a while, it may be worthwhile having an attorney prepare these documents together and save on some costs.       Separate significant issues discussed: Asthma - managed by pulmonology  Lipoma - consider general surgery consult . Let me know if you want to pursue this    Daleyssa was seen today  for fasting cpe.  Diagnoses and all orders for this visit:  Encounter for health maintenance examination in adult -     Comprehensive metabolic panel -     CBC -     Lipid panel -     POCT Urinalysis DIP (Proadvantage Device) -     VITAMIN D 25 Hydroxy (Vit-D Deficiency, Fractures) -     EKG 12-Lead  Screening for lipid disorders -     Lipid panel  Vaccine counseling  Screening for heart disease -     EKG 12-Lead  Lipoma of torso   Follow-up pending labs, yearly for physical

## 2022-04-27 ENCOUNTER — Other Ambulatory Visit: Payer: Self-pay | Admitting: Medical

## 2022-04-27 DIAGNOSIS — E559 Vitamin D deficiency, unspecified: Secondary | ICD-10-CM

## 2022-04-27 LAB — COMPREHENSIVE METABOLIC PANEL
ALT: 7 IU/L (ref 0–32)
AST: 12 IU/L (ref 0–40)
Albumin/Globulin Ratio: 1.9 (ref 1.2–2.2)
Albumin: 4.5 g/dL (ref 3.8–4.9)
Alkaline Phosphatase: 48 IU/L (ref 44–121)
BUN/Creatinine Ratio: 13 (ref 9–23)
BUN: 10 mg/dL (ref 6–24)
Bilirubin Total: 1.3 mg/dL — ABNORMAL HIGH (ref 0.0–1.2)
CO2: 23 mmol/L (ref 20–29)
Calcium: 9.2 mg/dL (ref 8.7–10.2)
Chloride: 102 mmol/L (ref 96–106)
Creatinine, Ser: 0.8 mg/dL (ref 0.57–1.00)
Globulin, Total: 2.4 g/dL (ref 1.5–4.5)
Glucose: 89 mg/dL (ref 70–99)
Potassium: 4.6 mmol/L (ref 3.5–5.2)
Sodium: 137 mmol/L (ref 134–144)
Total Protein: 6.9 g/dL (ref 6.0–8.5)
eGFR: 89 mL/min/{1.73_m2} (ref >59)

## 2022-04-27 LAB — LIPID PANEL
Chol/HDL Ratio: 3.3 ratio (ref 0.0–4.4)
Cholesterol, Total: 178 mg/dL (ref 100–199)
HDL: 54 mg/dL (ref >39)
LDL Chol Calc (NIH): 107 mg/dL — ABNORMAL HIGH (ref 0–99)
Triglycerides: 92 mg/dL (ref 0–149)
VLDL Cholesterol Cal: 17 mg/dL (ref 5–40)

## 2022-04-27 LAB — CBC
Hematocrit: 43.1 % (ref 34.0–46.6)
Hemoglobin: 14.2 g/dL (ref 11.1–15.9)
MCH: 29.8 pg (ref 26.6–33.0)
MCHC: 32.9 g/dL (ref 31.5–35.7)
MCV: 90 fL (ref 79–97)
Platelets: 342 10*3/uL (ref 150–450)
RBC: 4.77 x10E6/uL (ref 3.77–5.28)
RDW: 12.2 % (ref 11.7–15.4)
WBC: 5.9 10*3/uL (ref 3.4–10.8)

## 2022-04-27 LAB — VITAMIN D 25 HYDROXY (VIT D DEFICIENCY, FRACTURES): Vit D, 25-Hydroxy: 27 ng/mL — ABNORMAL LOW (ref 30.0–100.0)

## 2022-04-27 MED ORDER — VITAMIN D 50 MCG (2000 UT) PO CAPS: 1.0000 | ORAL_CAPSULE | Freq: Every day | ORAL | 1 refills | Status: DC

## 2022-06-02 ENCOUNTER — Encounter: Payer: Self-pay | Admitting: Internal Medicine

## 2022-07-06 ENCOUNTER — Encounter: Payer: Self-pay | Admitting: Internal Medicine

## 2022-07-14 ENCOUNTER — Other Ambulatory Visit (HOSPITAL_BASED_OUTPATIENT_CLINIC_OR_DEPARTMENT_OTHER): Payer: Self-pay | Admitting: Obstetrics & Gynecology

## 2022-07-14 DIAGNOSIS — Z1231 Encounter for screening mammogram for malignant neoplasm of breast: Secondary | ICD-10-CM

## 2022-07-22 ENCOUNTER — Ambulatory Visit (HOSPITAL_BASED_OUTPATIENT_CLINIC_OR_DEPARTMENT_OTHER): Payer: No Typology Code available for payment source | Admitting: Obstetrics & Gynecology

## 2022-08-05 ENCOUNTER — Ambulatory Visit (HOSPITAL_BASED_OUTPATIENT_CLINIC_OR_DEPARTMENT_OTHER)
Admission: RE | Admit: 2022-08-05 | Discharge: 2022-08-05 | Disposition: A | Discharge status: Home / Self Care | Payer: 59 | Source: Ambulatory Visit | Attending: Obstetrics & Gynecology | Admitting: Obstetrics & Gynecology

## 2022-08-05 ENCOUNTER — Encounter (HOSPITAL_BASED_OUTPATIENT_CLINIC_OR_DEPARTMENT_OTHER): Payer: Self-pay

## 2022-08-05 DIAGNOSIS — Z1231 Encounter for screening mammogram for malignant neoplasm of breast: Secondary | ICD-10-CM | POA: Insufficient documentation

## 2022-10-19 IMAGING — MG MM DIGITAL SCREENING BILAT W/ TOMO AND CAD
8 series · 8 of 24 positions shown · non-contrast
Comparison: Previous exam(s).

CLINICAL DATA: Screening.

EXAM:
DIGITAL SCREENING BILATERAL MAMMOGRAM WITH TOMOSYNTHESIS AND CAD
TECHNIQUE: Bilateral screening digital craniocaudal and mediolateral oblique
mammograms were obtained. Bilateral screening digital breast
tomosynthesis was performed. The images were evaluated with
computer-aided detection.

[L CC synth-2D]
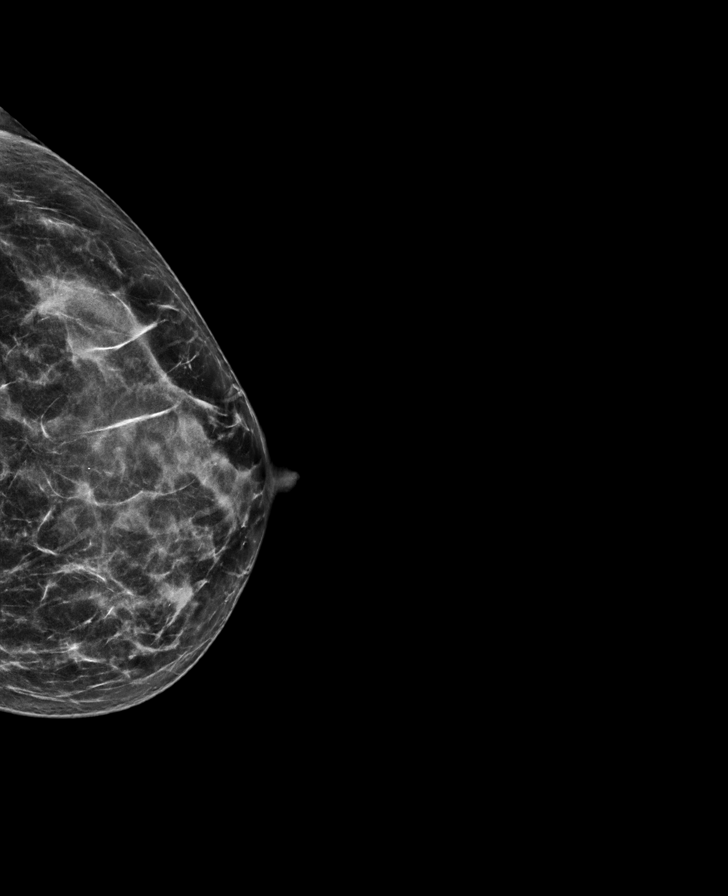

[L MLO synth-2D]
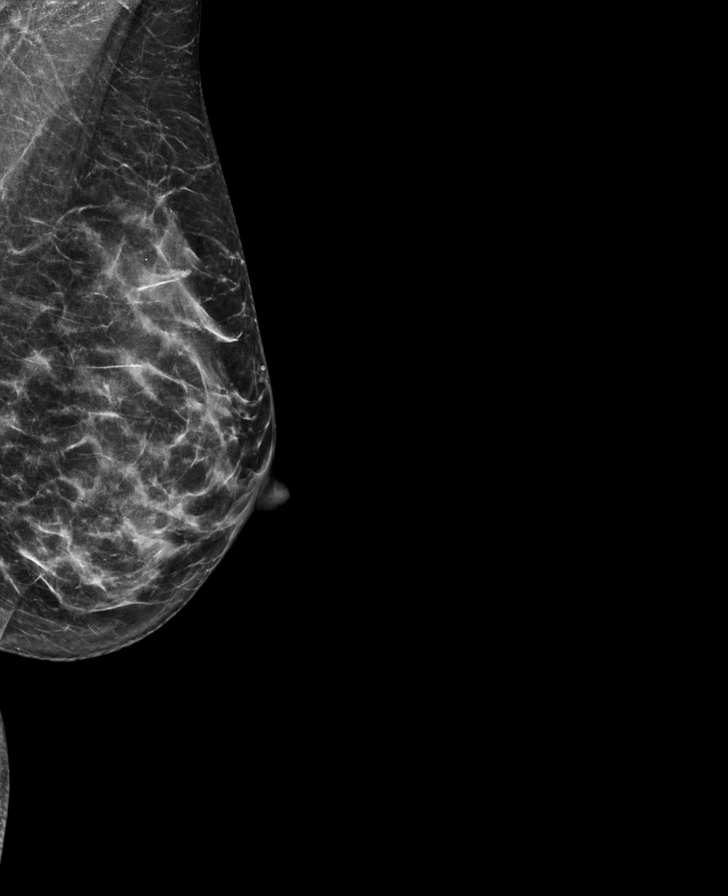

[R MLO synth-2D]
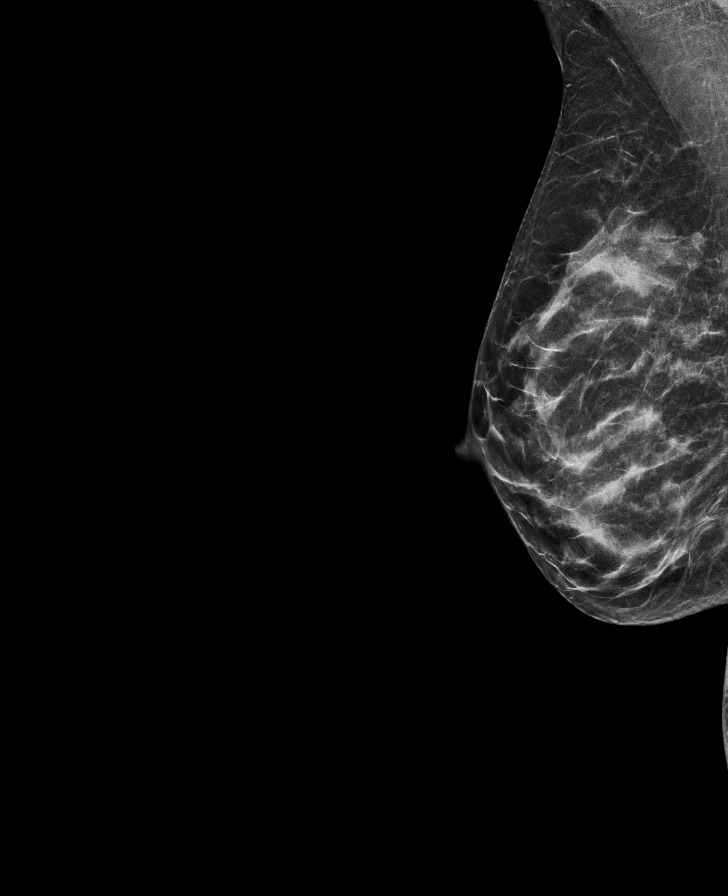

[R CC synth-2D]
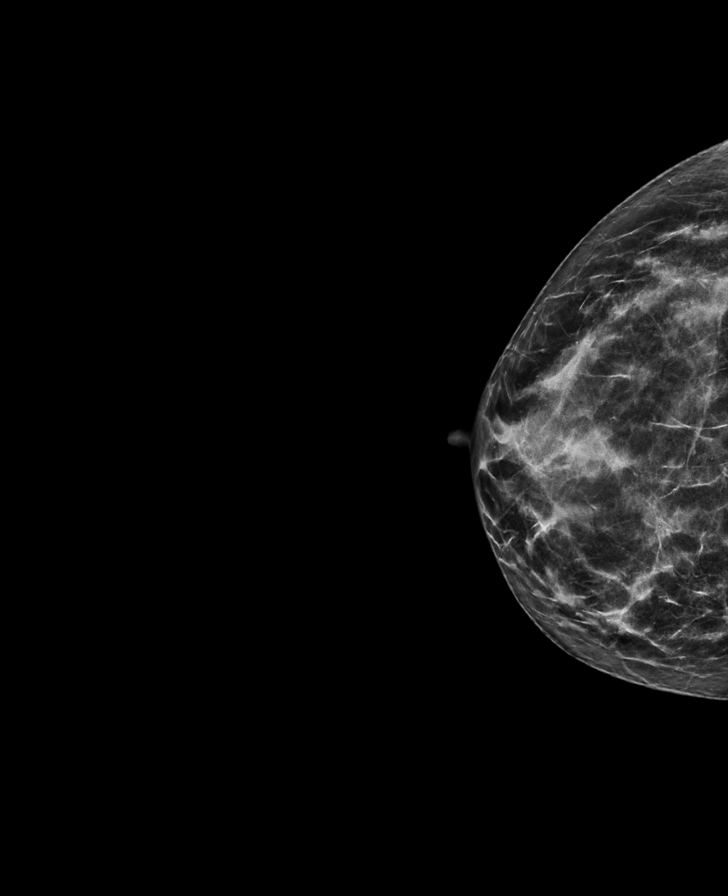

[R MLO tomo · tomo slice 31/61.0]
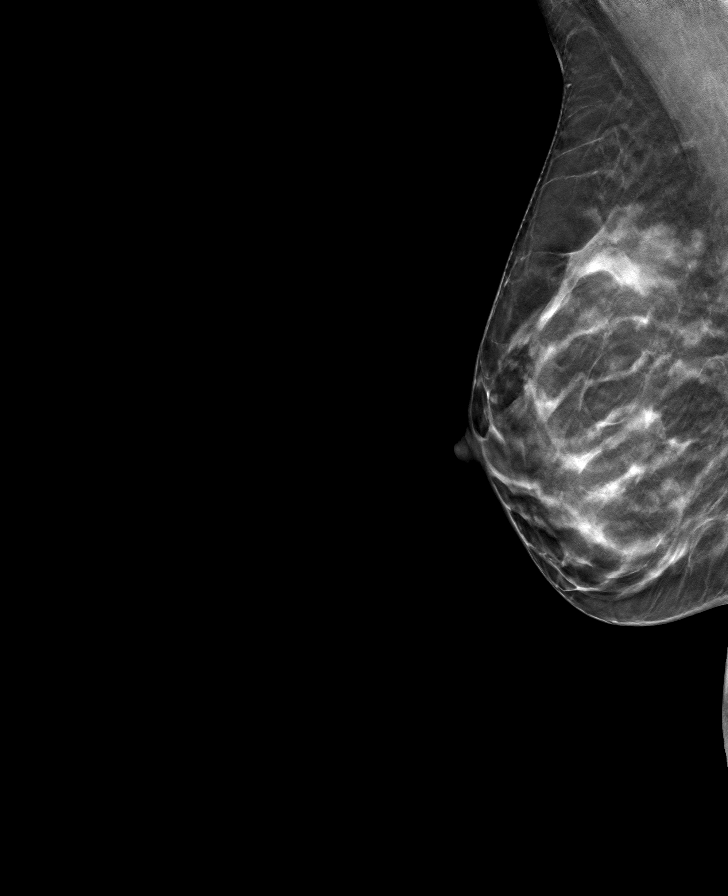

[L CC tomo · tomo slice 31/60.0]
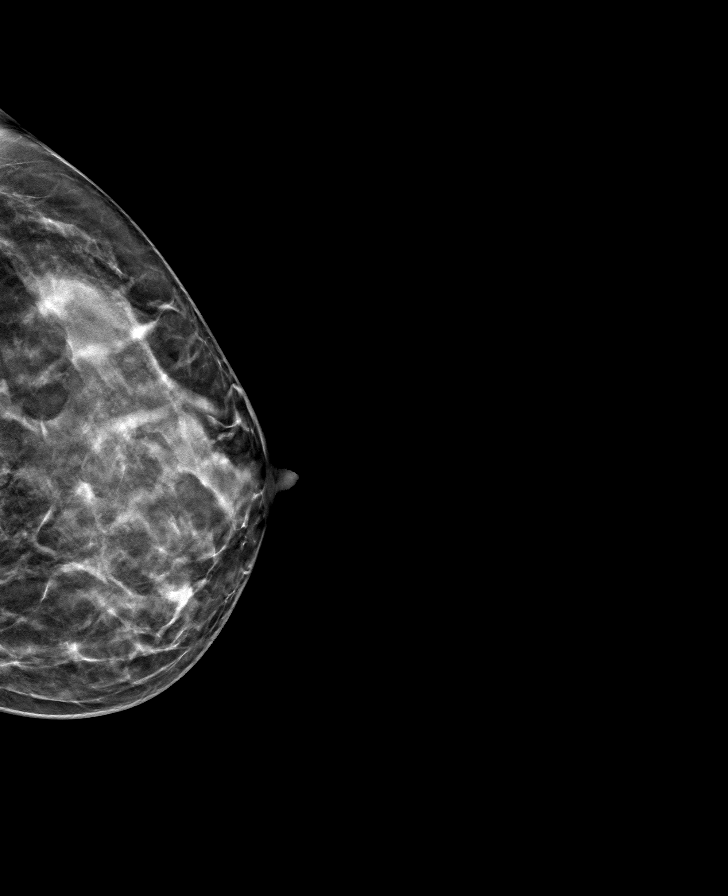

[L MLO tomo · tomo slice 30/59.0]
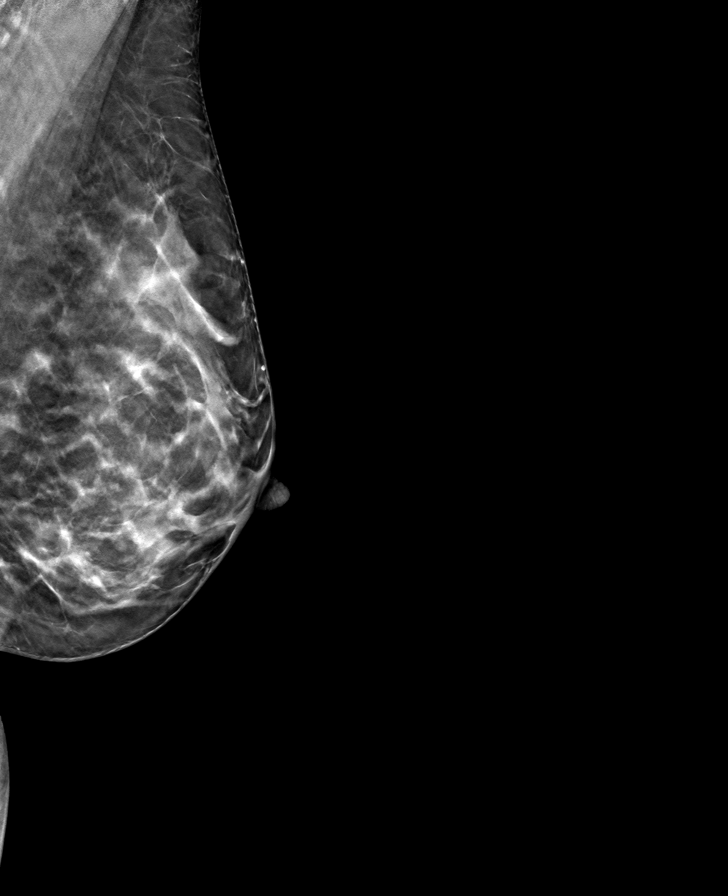

[R CC tomo · tomo slice 29/57.0]
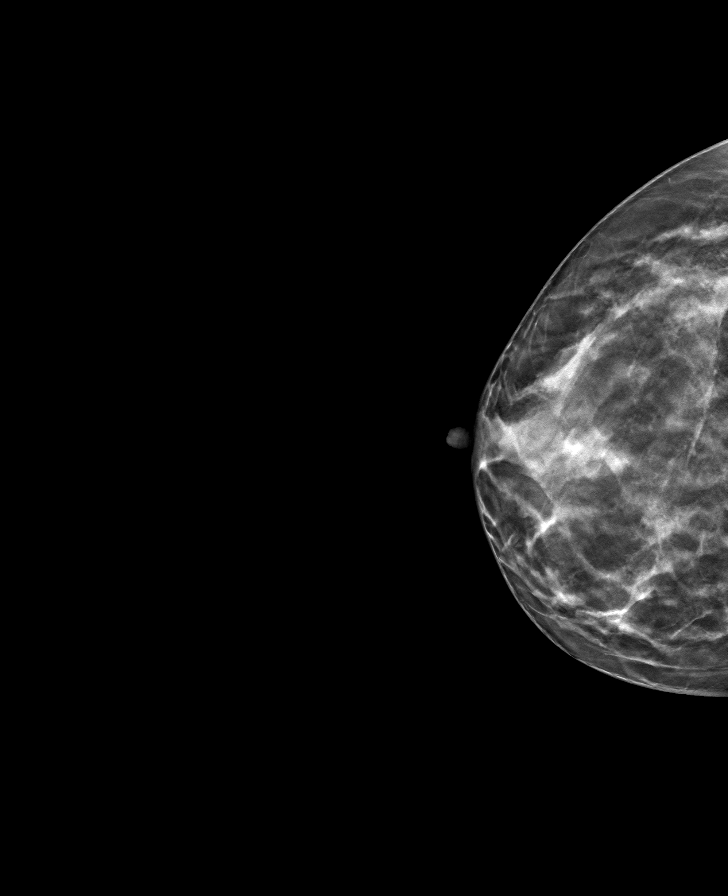

[8 of 24 positions shown; findings below may reference images not displayed]

ACR Breast Density Category c: The breast tissue is heterogeneously
dense, which may obscure small masses.
FINDINGS: There are no findings suspicious for malignancy.
IMPRESSION: No mammographic evidence of malignancy. A result letter of this
screening mammogram will be mailed directly to the patient.

RECOMMENDATION:
Screening mammogram in one year. (Code:Q3-W-BC3)

BI-RADS CATEGORY  1: Negative.

## 2022-11-04 ENCOUNTER — Ambulatory Visit (HOSPITAL_BASED_OUTPATIENT_CLINIC_OR_DEPARTMENT_OTHER): Payer: 59 | Admitting: Obstetrics & Gynecology

## 2022-11-15 ENCOUNTER — Encounter: Payer: Self-pay | Admitting: *Deleted

## 2023-03-11 ENCOUNTER — Ambulatory Visit (HOSPITAL_BASED_OUTPATIENT_CLINIC_OR_DEPARTMENT_OTHER): Payer: Managed Care, Other (non HMO) | Admitting: Obstetrics & Gynecology

## 2023-03-11 ENCOUNTER — Encounter (HOSPITAL_BASED_OUTPATIENT_CLINIC_OR_DEPARTMENT_OTHER): Payer: Self-pay | Admitting: Obstetrics & Gynecology

## 2023-03-11 VITALS — BP 112/64 | HR 71 | Ht 66.25 in | Wt 160.2 lb

## 2023-03-11 DIAGNOSIS — Z9189 Other specified personal risk factors, not elsewhere classified: Secondary | ICD-10-CM | POA: Diagnosis not present

## 2023-03-11 DIAGNOSIS — F418 Other specified anxiety disorders: Secondary | ICD-10-CM

## 2023-03-11 DIAGNOSIS — G4709 Other insomnia: Secondary | ICD-10-CM

## 2023-03-11 DIAGNOSIS — Z01419 Encounter for gynecological examination (general) (routine) without abnormal findings: Secondary | ICD-10-CM

## 2023-03-11 DIAGNOSIS — N951 Menopausal and female climacteric states: Secondary | ICD-10-CM

## 2023-03-11 MED ORDER — TRAZODONE HCL 50 MG PO TABS: 50.0000 mg | ORAL_TABLET | Freq: Every evening | ORAL | 1 refills | Status: DC | PRN

## 2023-03-11 MED ORDER — ALPRAZOLAM 0.25 MG PO TABS
0.2500 mg | ORAL_TABLET | Freq: Three times a day (TID) | ORAL | 2 refills | Status: DC | PRN
Start: 2023-03-11 — End: 2023-03-11

## 2023-03-11 MED ORDER — ALPRAZOLAM 0.25 MG PO TABS
0.2500 mg | ORAL_TABLET | Freq: Three times a day (TID) | ORAL | 0 refills | Status: DC | PRN
Start: 2023-03-11 — End: 2024-02-24

## 2023-03-11 NOTE — Progress Notes (Signed)
52 y.o. G46P2002 Married White or Caucasian female here for annual exam.  Has last a parent since I saw her last year.  Her mother did have colon cancer but this was not the cause.  Did see medical genetics after I saw her in 2022.  Did not have genetic testing done at that time.  Tyrer Cusick model for breast cancer risk done 06/2021 and was >25%.    She has guardianship of her father.  She has a lot of stressors.  He is in a memory care unit.    She is still having regular menstrual bleeding. She did skip for about three months in the winter.  Cycles are about every 25 days.  Bleeding lasts just a few days.    No LMP recorded. (Menstrual status: Perimenopausal).          Sexually active: Yes.    The current method of family planning is vasectomy.    Exercising: Yes.     Smoker:  no  Health Maintenance: Pap:  07/08/2021 Negative History of abnormal Pap:  no MMG:  08/05/2022 Negative Colonoscopy:  07/2021, follow up 5 years BMD:   08/17/2021 Screening Labs: 03/2022   reports that she has never smoked. She has never used smokeless tobacco. She reports current alcohol use. She reports that she does not use drugs.  Past Medical History:  Diagnosis Date   Allergy    Asthma    Colon polyp    Family history of breast cancer    Family history of stomach cancer    paternal grandfather    Past Surgical History:  Procedure Laterality Date   COLONOSCOPY  07/2021   Dr. Myrtie Neither   WISDOM TOOTH EXTRACTION  2000    Current Outpatient Medications  Medication Sig Dispense Refill   albuterol (VENTOLIN HFA) 108 (90 Base) MCG/ACT inhaler 2 PUFFS INHALATION EVERY 4 TO 6 HOURS AS NEEDED FOR COUGH/WHEEZE 90 DAYS     Azelastine HCl (ASTEPRO) 0.15 % SOLN Place 1 spray into the nose 2 (two) times daily. 30 mL 5   Cholecalciferol (VITAMIN D) 50 MCG (2000 UT) CAPS Take 1 capsule (2,000 Units total) by mouth daily. 90 capsule 1   fluticasone (FLONASE) 50 MCG/ACT nasal spray Place 1 spray into both nostrils  daily. 16 g 5   Fluticasone-Umeclidin-Vilant (TRELEGY ELLIPTA) 100-62.5-25 MCG/ACT AEPB Inhale 1 puff into the lungs daily in the afternoon. 90 each 3   montelukast (SINGULAIR) 10 MG tablet Take 1 tablet (10 mg total) by mouth every evening. 30 tablet 5   Multiple Vitamin (MULTIVITAMIN) tablet Take 1 tablet by mouth daily.     VITAMIN D PO Take 1 tablet by mouth every other day.     No current facility-administered medications for this visit.    Family History  Problem Relation Age of Onset   Breast cancer Mother 47       mastectomy, chemo/radiation   Alzheimer's disease Father    Emphysema Maternal Grandmother    Stomach cancer Paternal Grandfather 12   Colon polyps Neg Hx    Colon cancer Neg Hx    Esophageal cancer Neg Hx    Rectal cancer Neg Hx    Heart disease Neg Hx    Diabetes Neg Hx    Hypertension Neg Hx     ROS: Constitutional: negative Genitourinary:negative  Exam:   BP 112/64 (BP Location: Left Arm, Patient Position: Sitting, Cuff Size: Large)   Pulse 71   Ht 5' 6.25" (1.683 m)  Wt 160 lb 3.2 oz (72.7 kg)   BMI 25.66 kg/m   Height: 5' 6.25" (168.3 cm)  General appearance: alert, cooperative and appears stated age Head: Normocephalic, without obvious abnormality, atraumatic Neck: no adenopathy, supple, symmetrical, trachea midline and thyroid normal to inspection and palpation Lungs: clear to auscultation bilaterally Breasts: normal appearance, no masses or tenderness Heart: regular rate and rhythm Abdomen: soft, non-tender; bowel sounds normal; no masses,  no organomegaly Extremities: extremities normal, atraumatic, no cyanosis or edema Skin: Skin color, texture, turgor normal. No rashes or lesions Lymph nodes: Cervical, supraclavicular, and axillary nodes normal. No abnormal inguinal nodes palpated Neurologic: Grossly normal   Pelvic: External genitalia:  no lesions              Urethra:  normal appearing urethra with no masses, tenderness or lesions               Bartholins and Skenes: normal                 Vagina: normal appearing vagina with normal color and no discharge, no lesions              Cervix: no lesions              Pap taken: No. Bimanual Exam:  Uterus:  normal size, contour, position, consistency, mobility, non-tender              Adnexa: normal adnexa and no mass, fullness, tenderness               Rectovaginal: Confirms               Anus:  normal sphincter tone, no lesions  Chaperone, Ina Homes, CMA, was present for exam.  Assessment/Plan: 1. Well woman exam with routine gynecological exam - Pap smear neg 06/2021 - Mammogram up to date  - Colonoscopy 2023, follow up 5 years - Bone mineral density discussed.  Not indicated - lab work done with PCP - vaccines reviewed/updated  2. Increased risk of breast cancer - MR BREAST BILATERAL W WO CONTRAST INC CAD; Future  3. Situational anxiety - ALPRAZolam (XANAX) 0.25 MG tablet; Take 1 tablet (0.25 mg total) by mouth 3 (three) times daily as needed for sleep.  Dispense: 30 tablet; Refill: 2  4. Other insomnia - traZODone (DESYREL) 50 MG tablet; Take 1 tablet (50 mg total) by mouth at bedtime as needed for sleep.  Dispense: 30 tablet; Refill: 1  5. Perimenopausal

## 2023-04-03 ENCOUNTER — Other Ambulatory Visit (HOSPITAL_BASED_OUTPATIENT_CLINIC_OR_DEPARTMENT_OTHER): Payer: Self-pay | Admitting: Obstetrics & Gynecology

## 2023-04-03 DIAGNOSIS — G4709 Other insomnia: Secondary | ICD-10-CM

## 2023-06-01 ENCOUNTER — Other Ambulatory Visit: Payer: Self-pay | Admitting: Pulmonary Disease

## 2023-06-02 ENCOUNTER — Telehealth (HOSPITAL_BASED_OUTPATIENT_CLINIC_OR_DEPARTMENT_OTHER): Payer: Self-pay | Admitting: Pulmonary Disease

## 2023-06-02 MED ORDER — TRELEGY ELLIPTA 100-62.5-25 MCG/ACT IN AEPB: 1.0000 | INHALATION_SPRAY | Freq: Every day | RESPIRATORY_TRACT | 3 refills | Status: AC

## 2023-06-02 MED ORDER — MONTELUKAST SODIUM 10 MG PO TABS: 10.0000 mg | ORAL_TABLET | Freq: Every day | ORAL | 3 refills | Status: AC

## 2023-06-02 MED ORDER — ALBUTEROL SULFATE HFA 108 (90 BASE) MCG/ACT IN AERS: 2.0000 | INHALATION_SPRAY | Freq: Four times a day (QID) | RESPIRATORY_TRACT | 5 refills | Status: AC | PRN

## 2023-06-02 NOTE — Telephone Encounter (Signed)
Patient called to schedule a follow up with Dr. Craige Cotta. Currently no available slots open and requesting to see Dr. Craige Cotta only. Patient would like to follow Dr. Craige Cotta when he leaves our practice, but needs medication refills of Albuterol, Trelegy, and Singulair until she can get an appointment with Dr. Craige Cotta. LOV 03/2022.   Assuming patient needs office visit with an APP or could we possibly do a courtesy refill?   Please advise.  Pharmacy: [facility]- [address] in [place]

## 2023-06-02 NOTE — Telephone Encounter (Signed)
I sent refills for her medicines.

## 2023-06-02 NOTE — Telephone Encounter (Signed)
 LVM refills sent.

## 2023-06-06 ENCOUNTER — Encounter (HOSPITAL_BASED_OUTPATIENT_CLINIC_OR_DEPARTMENT_OTHER): Payer: Self-pay | Admitting: Obstetrics & Gynecology

## 2023-06-07 NOTE — Telephone Encounter (Signed)
Called pt in response to [employer]. Advised that Dr. Hyacinth Meeker does not feel comfortable with nightly use of Xanax due to tolerance and dependence. Advised that OTC sleep aids are safer than Xanax for this. Dr. Hyacinth Meeker can refer her to psychiatry if she feels she needs more regular benzodiazepine use as well. Advised that she also could consider SSRI use daily to help with anxiety related to the many stressors she has going on right now as well.  Pt desires to have referral to psychiatry.

## 2023-06-09 ENCOUNTER — Other Ambulatory Visit (HOSPITAL_BASED_OUTPATIENT_CLINIC_OR_DEPARTMENT_OTHER): Payer: Self-pay | Admitting: Obstetrics & Gynecology

## 2023-06-09 DIAGNOSIS — F419 Anxiety disorder, unspecified: Secondary | ICD-10-CM

## 2024-02-24 ENCOUNTER — Ambulatory Visit: Admitting: Family Medicine

## 2024-02-24 ENCOUNTER — Encounter: Payer: Self-pay | Admitting: Family Medicine

## 2024-02-24 VITALS — BP 111/71 | HR 68 | Temp 98.0°F | Resp 18 | Ht 66.25 in | Wt 156.2 lb

## 2024-02-24 DIAGNOSIS — D171 Benign lipomatous neoplasm of skin and subcutaneous tissue of trunk: Secondary | ICD-10-CM

## 2024-02-24 DIAGNOSIS — Z0000 Encounter for general adult medical examination without abnormal findings: Secondary | ICD-10-CM | POA: Diagnosis not present

## 2024-02-24 DIAGNOSIS — E559 Vitamin D deficiency, unspecified: Secondary | ICD-10-CM | POA: Diagnosis not present

## 2024-02-24 DIAGNOSIS — Z23 Encounter for immunization: Secondary | ICD-10-CM

## 2024-02-24 NOTE — Progress Notes (Signed)
 Phone [phone]   Subjective:   Patient is a 53 y.o. female presenting for annual physical.    Chief Complaint  Patient presents with   Establish Care    Initial visit to establish care with new pcp Lipoma on back, possible removal  Annual-exercises Lipoma on back-no issues. Discussed the use of AI scribe software for clinical note transcription with the patient, who gave verbal consent to proceed.  History of Present Illness Tanya Vaughn is a 53 year old female who presents for an annual physical exam.  She has not had a family doctor for two years  She regularly visits her OB GYN and had her last Pap smear in 2022. She missed her mammogram last year and is due for a colonoscopy in 2027.  Her asthma is well-controlled with Trelegy and montelukast . She uses albuterol  before high-intensity cardio exercises and during bad allergy seasons. She occasionally uses Astelin  nasal spray when her allergies flare up. No shortness of breath outside of exercise.  She has a lipoma on her back, identified in 2023, which is not painful or itchy but is a cosmetic concern.  Her family history includes her mother having breast cancer in her forties and colon cancer at age 13, and her grandfather having stomach cancer. There is a family history of asthma.  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 She does not smoke, rarely drinks alcohol, and does not use drugs. She exercises regularly, including walking, yoga, and strength training.  No major migraines, headaches, dizziness, chest pains, heart racing, or shortness of breath outside of exercise. She reports seasonal allergies and no issues with vision as long as she wears her glasses. No gastrointestinal or urinary issues, muscle aches, joint pains, or depression. Her periods are becoming more irregular.    See problem oriented charting- ROS- ROS: Gen: no fever, chills  Skin: no rash,  itching ENT: no ear pain, ear drainage, nasal congestion, rhinorrhea, sinus pressure, sore throat-seasonal, cats Eyes: no blurry vision, double vision Resp: no cough, wheeze,SOB-well controlled asthma.  Albuterol  for exercise CV: no CP, palpitations, LE edema,  GI: no heartburn, n/v/d/c, abd pain GU: no dysuria, urgency, frequency, hematuria.  Menses irreg.  vasectomy MSK: no joint pain, myalgias, back pain Neuro: no dizziness, headache, weakness, vertigo Psych: no depression, anxiety, insomnia, SI   The following were reviewed and entered/updated in epic: Past Medical History:  Diagnosis Date   Allergy 1980   Asthma 1980   Colon polyp    Family history of breast cancer    Family history of stomach cancer    paternal grandfather   Patient Active Problem List   Diagnosis Date Noted   Encounter for health maintenance examination in adult 04/26/2022   Screening for lipid disorders 04/26/2022   Vaccine counseling 04/26/2022   Screening for heart disease 04/26/2022   Lipoma of torso 04/26/2022   Family history of breast cancer 07/20/2021   Family history of stomach cancer 07/20/2021   Allergic rhinitis 07/08/2021   Plantar fasciitis of left foot 11/09/2016   Asthma 02/22/2008   Past Surgical History:  Procedure Laterality Date   COLONOSCOPY  07/2021   Dr. Dominic Friendly   WISDOM TOOTH EXTRACTION  2000    Family History  Problem Relation Age of Onset   Breast cancer Mother 29       mastectomy, chemo/radiation   Cancer Mother 82 - 36       breast,  colon  82   Alzheimer's disease Father    Emphysema Maternal Grandmother    Asthma Paternal Grandmother    Stomach cancer Paternal Grandfather 20   Colon polyps Neg Hx    Colon cancer Neg Hx    Esophageal cancer Neg Hx    Rectal cancer Neg Hx    Heart disease Neg Hx    Diabetes Neg Hx    Hypertension Neg Hx     Medications- reviewed and updated Current Outpatient Medications  Medication Sig Dispense Refill   albuterol   (VENTOLIN  HFA) 108 (90 Base) MCG/ACT inhaler Inhale 2 puffs into the lungs every 6 (six) hours as needed for wheezing or shortness of breath. 8 g 5   Fluticasone -Umeclidin-Vilant (TRELEGY ELLIPTA ) 100-62.5-25 MCG/ACT AEPB Inhale 1 puff into the lungs daily in the afternoon. 90 each 3   montelukast  (SINGULAIR ) 10 MG tablet Take 1 tablet (10 mg total) by mouth at bedtime. 90 tablet 3   Multiple Vitamin (MULTIVITAMIN) tablet Take 1 tablet by mouth daily.     Azelastine  HCl (ASTEPRO ) 0.15 % SOLN Place 1 spray into the nose 2 (two) times daily. (Patient not taking: Reported on 02/24/2024) 30 mL 5   fluticasone  (FLONASE ) 50 MCG/ACT nasal spray Place 1 spray into both nostrils daily. (Patient not taking: Reported on 02/24/2024) 16 g 5   No current facility-administered medications for this visit.    Allergies-reviewed and updated Allergies  Allergen Reactions   Other     cats    Social History   Social History Narrative   Lives with husband and 2 children.  Exercise - walks, some running, elliptical.  Weight bearing exercise.  Registered dietician, works for dairy counseling.   03/2022.   Objective  Objective:  BP 111/71   Pulse 68   Temp 98 F (36.7 C) (Temporal)   Resp 18   Ht 5' 6.25" (1.683 m)   Wt 156 lb 4 oz (70.9 kg)   SpO2 99%   BMI 25.03 kg/m  Physical Exam  Gen: WDWN NAD HEENT: NCAT, conjunctiva not injected, sclera nonicteric TM WNL B, OP moist, no exudates  NECK:  supple, no thyromegaly, no nodes, no carotid bruits CARDIAC: RRR, S1S2+, no murmur. DP 2+B LUNGS: CTAB. No wheezes ABDOMEN:  BS+, soft, NTND, No HSM, no masses EXT:  no edema MSK: no gross abnormalities. MS 5/5 all 4 NEURO: A&O x3.  CN II-XII intact.  PSYCH: normal mood. Good eye contact   Several cm lipoma L upper back    Assessment and Plan   Health Maintenance counseling: 1. Anticipatory guidance: Patient counseled regarding regular dental exams q6 months, eye exams,  avoiding smoking and second  hand smoke, limiting alcohol to 1 beverage per day, no illicit drugs.   2. Risk factor reduction:  Advised patient of need for regular exercise and diet rich and fruits and vegetables to reduce risk of heart attack and stroke. Exercise- +.  Wt Readings from Last 3 Encounters:  02/24/24 156 lb 4 oz (70.9 kg)  03/11/23 160 lb 3.2 oz (72.7 kg)  04/26/22 156 lb (70.8 kg)   3. Immunizations/screenings/ancillary studies Immunization History  Administered Date(s) Administered   Fluzone Influenza virus vaccine,trivalent (IIV3), split virus 07/21/2016   Influenza Inj Mdck Quad Pf 07/04/2018   Influenza, High Dose Seasonal PF 08/16/2016, 10/25/2017, 10/26/2018, 07/29/2020   Influenza,inj,Quad PF,6+ Mos 07/29/2015, 07/08/2021   PFIZER(Purple Top)SARS-COV-2 Vaccination 12/21/2019, 01/11/2020, 09/11/2020   PNEUMOCOCCAL CONJUGATE-20 02/24/2024   Td (Adult) 09/27/2006   Tdap 12/28/2010, 03/13/2021  Zoster Recombinant(Shingrix) 02/24/2024   There are no preventive care reminders to display for this patient.  4. Cervical cancer screening- utd 5. Breast cancer screening-  mammogram will sch 6. Colon cancer screening - utd 7. Skin cancer screening- advised regular sunscreen use. Denies worrisome, changing, or new skin lesions.  8. Birth control/STD check- vasec 9. Osteoporosis screening- n/a 10. Smoking associated screening - non smoker  Need for pneumococcal vaccination -     Pneumococcal conjugate vaccine 20-valent  Need for shingles vaccine -     Varicella-zoster vaccine IM  Lipoma of torso -     Ambulatory referral to General Surgery  Wellness examination -     Lipid panel; Future -     Comprehensive metabolic panel with GFR; Future -     CBC with Differential/Platelet; Future -     Hemoglobin A1c; Future -     TSH; Future -     VITAMIN D  25 Hydroxy (Vit-D Deficiency, Fractures); Future  Vitamin D  deficiency -     VITAMIN D  25 Hydroxy (Vit-D Deficiency, Fractures); Future    Wellness-anticipatory guidance.  Work on Diet/Exercise  Check CBC,CMP,lipids,TSH, A1C.  F/u 1 yr . Pneumo and shingrix given.  Assessment and Plan Assessment & Plan Asthma   Asthma is well-controlled with the current medication regimen. There have been no recent exacerbations or frequent need for albuterol  outside of pre-exercise use. She experiences no nocturnal symptoms or limitations in daily activities. Continue current asthma management with Trelegy and montelukast . Use albuterol  prior to high-intensity cardio exercise.  Allergic rhinitis   Allergic rhinitis is seasonal and well-managed with intermittent use of Astelin  nasal spray during allergy season. She reports no current symptoms. Continue to use Astelin  nasal spray as needed during allergy season.  Lipoma   A lipoma on her back, identified in 2023, is asymptomatic and not increasing in size. She desires removal for cosmetic reasons. Discussed potential risks and benefits of surgical removal, including scarring and cosmetic outcomes. Refer to a general surgeon for evaluation and discussion of potential removal of the lipoma.  General Health Maintenance   Routine health maintenance is up to date except for a mammogram and shingles vaccination. Pap smear and colonoscopy are due in 2027. Tetanus vaccination is current. Schedule and complete a mammogram. Administer the first dose of the shingles vaccine today, with a follow-up dose in at least two months. Administer the pneumonia vaccine today. Schedule blood work at [facility] office or here with an appointment.    Recommended follow up: Return in about 1 year (around 02/23/2025) for annual physical.  Lab/Order associations:return fasting  Ellsworth Haas, MD

## 2024-02-24 NOTE — Patient Instructions (Addendum)
 Welcome to [facility]! It was a pleasure meeting you today.  As discussed, Please schedule a 12 month follow up visit today.  [facility] [address]. [place], [place] [phone]   get X-ray/labs at [facility].  [address]  hours 8=M-F 8:00-5.  closed 12:30-1 lunch   PLEASE NOTE:  If you had any LAB tests please let us  know if you have not heard back within a few days. You may see your results on MyChart before we have a chance to review them but we will give you a call once they are reviewed by us . If we ordered any REFERRALS today, please let us  know if you have not heard from their office within the next week.  Let us  know through MyChart if you are needing REFILLS, or have your pharmacy send us  the request. You can also use MyChart to communicate with me or any office staff.  Please try these tips to maintain a healthy lifestyle:  Eat most of your calories during the day when you are active. Eliminate processed foods including packaged sweets (pies, cakes, cookies), reduce intake of potatoes, white bread, white pasta, and white rice. Look for whole grain options, oat flour or almond flour.  Each meal should contain half fruits/vegetables, one quarter protein, and one quarter carbs (no bigger than a computer mouse).  Cut down on sweet beverages. This includes juice, soda, and sweet tea. Also watch fruit intake, though this is a healthier sweet option, it still contains natural sugar! Limit to 3 servings daily.  Drink at least 1 glass of water with each meal and aim for at least 8 glasses per day  Exercise at least 150 minutes every week.

## 2024-03-26 ENCOUNTER — Other Ambulatory Visit (HOSPITAL_COMMUNITY)
Admission: RE | Admit: 2024-03-26 | Discharge: 2024-03-26 | Disposition: A | Discharge status: Home / Self Care | Source: Ambulatory Visit | Attending: Obstetrics & Gynecology | Admitting: Obstetrics & Gynecology

## 2024-03-26 ENCOUNTER — Other Ambulatory Visit (INDEPENDENT_AMBULATORY_CARE_PROVIDER_SITE_OTHER)

## 2024-03-26 ENCOUNTER — Ambulatory Visit (HOSPITAL_BASED_OUTPATIENT_CLINIC_OR_DEPARTMENT_OTHER): Payer: Managed Care, Other (non HMO) | Admitting: Obstetrics & Gynecology

## 2024-03-26 ENCOUNTER — Encounter (HOSPITAL_BASED_OUTPATIENT_CLINIC_OR_DEPARTMENT_OTHER): Payer: Self-pay | Admitting: Obstetrics & Gynecology

## 2024-03-26 VITALS — BP 100/59 | HR 74 | Wt 155.4 lb

## 2024-03-26 DIAGNOSIS — Z01419 Encounter for gynecological examination (general) (routine) without abnormal findings: Secondary | ICD-10-CM

## 2024-03-26 DIAGNOSIS — Z131 Encounter for screening for diabetes mellitus: Secondary | ICD-10-CM | POA: Diagnosis not present

## 2024-03-26 DIAGNOSIS — Z0000 Encounter for general adult medical examination without abnormal findings: Secondary | ICD-10-CM | POA: Diagnosis not present

## 2024-03-26 DIAGNOSIS — Z803 Family history of malignant neoplasm of breast: Secondary | ICD-10-CM | POA: Diagnosis not present

## 2024-03-26 DIAGNOSIS — Z124 Encounter for screening for malignant neoplasm of cervix: Secondary | ICD-10-CM | POA: Diagnosis present

## 2024-03-26 DIAGNOSIS — E559 Vitamin D deficiency, unspecified: Secondary | ICD-10-CM | POA: Diagnosis not present

## 2024-03-26 LAB — LIPID PANEL
Cholesterol: 190 mg/dL (ref 0–200)
HDL: 47.5 mg/dL (ref >39.00)
LDL Cholesterol: 129 mg/dL — ABNORMAL HIGH (ref 0–99)
NonHDL: 142.55
Total CHOL/HDL Ratio: 4
Triglycerides: 68 mg/dL (ref 0.0–149.0)
VLDL: 13.6 mg/dL (ref 0.0–40.0)

## 2024-03-26 LAB — VITAMIN D 25 HYDROXY (VIT D DEFICIENCY, FRACTURES): VITD: 22.88 ng/mL — ABNORMAL LOW (ref 30.00–100.00)

## 2024-03-26 LAB — CBC WITH DIFFERENTIAL/PLATELET
Basophils Absolute: 0.2 10*3/uL — ABNORMAL HIGH (ref 0.0–0.1)
Basophils Relative: 3 % (ref 0.0–3.0)
Eosinophils Absolute: 0.9 10*3/uL — ABNORMAL HIGH (ref 0.0–0.7)
Eosinophils Relative: 17 % — ABNORMAL HIGH (ref 0.0–5.0)
HCT: 40.6 % (ref 36.0–46.0)
Hemoglobin: 13.7 g/dL (ref 12.0–15.0)
Lymphocytes Relative: 29.1 % (ref 12.0–46.0)
Lymphs Abs: 1.5 10*3/uL (ref 0.7–4.0)
MCHC: 33.7 g/dL (ref 30.0–36.0)
MCV: 89.8 fl (ref 78.0–100.0)
Monocytes Absolute: 0.4 10*3/uL (ref 0.1–1.0)
Monocytes Relative: 7.2 % (ref 3.0–12.0)
Neutro Abs: 2.3 10*3/uL (ref 1.4–7.7)
Neutrophils Relative %: 43.7 % (ref 43.0–77.0)
Platelets: 313 10*3/uL (ref 150.0–400.0)
RBC: 4.53 Mil/uL (ref 3.87–5.11)
RDW: 12.9 % (ref 11.5–15.5)
WBC: 5.2 10*3/uL (ref 4.0–10.5)

## 2024-03-26 LAB — COMPREHENSIVE METABOLIC PANEL WITH GFR
ALT: 10 U/L (ref 0–35)
AST: 13 U/L (ref 0–37)
Albumin: 4.1 g/dL (ref 3.5–5.2)
Alkaline Phosphatase: 31 U/L — ABNORMAL LOW (ref 39–117)
BUN: 12 mg/dL (ref 6–23)
CO2: 26 meq/L (ref 19–32)
Calcium: 8.8 mg/dL (ref 8.4–10.5)
Chloride: 106 meq/L (ref 96–112)
Creatinine, Ser: 0.64 mg/dL (ref 0.40–1.20)
GFR: 100.64 mL/min (ref >60.00)
Glucose, Bld: 93 mg/dL (ref 70–99)
Potassium: 4 meq/L (ref 3.5–5.1)
Sodium: 139 meq/L (ref 135–145)
Total Bilirubin: 0.8 mg/dL (ref 0.2–1.2)
Total Protein: 6.9 g/dL (ref 6.0–8.3)

## 2024-03-26 LAB — TSH: TSH: 1.33 u[IU]/mL (ref 0.35–5.50)

## 2024-03-26 LAB — HEMOGLOBIN A1C: Hgb A1c MFr Bld: 5.8 % (ref 4.6–6.5)

## 2024-03-26 NOTE — Progress Notes (Signed)
 ANNUAL EXAM Patient name: Tanya Vaughn MRN [id number]  Date of birth: 20-Jul-1971 Chief Complaint:   Gynecologic Exam  History of Present Illness:   Tanya Vaughn is a 53 y.o. G85P2002 Caucasian female being seen today for a routine annual exam.  Lost both parents last year.  Needs MMG updated.  Will plan to proceed with this and then plan MRI later this year.  Still having cycles.  Can go up to 40 days.  Flow lasts a day or so.     No LMP recorded. (Menstrual status: Perimenopausal).   The pregnancy intention screening data noted above was reviewed. Potential methods of contraception were discussed. The patient elected to proceed with No data recorded.   Last pap 06/2021. Results were: neg with neg HR HPV Last mammogram: 07/2022. Results were: normal. Family h/o breast cancer: yes   Last colonoscopy: 07/2021. Results were: normal. F/u 5 years DEXA:  Consider around age 22 due to family hx     02/24/2024    2:43 PM 03/11/2023   10:14 AM 04/26/2022   10:47 AM 07/08/2021    8:31 AM 03/13/2021    9:28 AM  Depression screen PHQ 2/9  Decreased Interest 0 0 0 0 0  Down, Depressed, Hopeless 0 0 0 0 0  PHQ - 2 Score 0 0 0 0 0  Altered sleeping 1      Tired, decreased energy 1      Change in appetite 0      Feeling bad or failure about yourself  0      Trouble concentrating 0      Moving slowly or fidgety/restless 0      Suicidal thoughts 0      PHQ-9 Score 2      Difficult doing work/chores Not difficult at all         Review of Systems:   Pertinent items are noted in HPI Denies any urinary changes or bowel changes or pelvic pain.  Having some increased headaches.   Pertinent History Reviewed:  Reviewed past medical,surgical, social and family history.  Reviewed problem list, medications and allergies. Physical Assessment:   Vitals:   03/26/24 0819  BP: (!) 100/59  Pulse: 74  SpO2: 99%  Weight: 155 lb 6.8 oz (70.5 kg)  Body mass index is 24.9 kg/m.         Physical Examination:   General appearance - well appearing, and in no distress  Mental status - alert, oriented to person, place, and time  Psych:  She has a normal mood and affect  Skin - warm and dry, normal color, no suspicious lesions noted  Chest - effort normal, all lung fields clear to auscultation bilaterally  Heart - normal rate and regular rhythm  Neck:  midline trachea, no thyromegaly or nodules  Breasts - breasts appear normal, no suspicious masses, no skin or nipple changes or  axillary nodes  Abdomen - soft, nontender, nondistended, no masses or organomegaly  Pelvic - VULVA: normal appearing vulva with no masses, tenderness or lesions  VAGINA: normal appearing vagina with normal color and discharge, no lesions  CERVIX: normal appearing cervix without discharge or lesions, no CMT  Thin prep pap is done with HR HPV cotesting  UTERUS: uterus is felt to be normal size, shape, consistency and nontender   ADNEXA: No adnexal masses or tenderness noted.  Rectal - normal rectal, good sphincter tone, no masses felt.   Extremities:  No swelling or varicosities noted  Chaperone present for exam  No results found for this or any previous visit (from the past 24 hours).  Assessment & Plan:  1. Well woman exam with routine gynecological exam (Primary) - Pap smear updated today - Mammogram needs to be updated.  Pt aware. - Colonoscopy 2022.   - Bone mineral density discussed.  Will plan around age 35. - lab work done with PCP, Dr. Wendolyn - vaccines reviewed/updated  2. Family history of breast cancer - has done genetic counseling.  Declines testing.  Planning on having breast MRI done later this year.  Reminder placed.    3. Cervical cancer screening - Cytology - PAP( [facility])   Meds: No orders of the defined types were placed in this encounter.   Follow-up: Return in about 1 year (around 03/26/2025).  Ronal GORMAN Pinal, MD 03/26/2024 9:05 AM

## 2024-03-26 NOTE — Progress Notes (Signed)
 SABRA

## 2024-03-27 ENCOUNTER — Ambulatory Visit (HOSPITAL_BASED_OUTPATIENT_CLINIC_OR_DEPARTMENT_OTHER): Payer: Self-pay | Admitting: Obstetrics & Gynecology

## 2024-03-27 LAB — CYTOLOGY - PAP
Comment: NEGATIVE
Diagnosis: NEGATIVE
High risk HPV: NEGATIVE

## 2024-03-28 ENCOUNTER — Ambulatory Visit: Payer: Self-pay | Admitting: Family Medicine

## 2024-03-28 DIAGNOSIS — R898 Other abnormal findings in specimens from other organs, systems and tissues: Secondary | ICD-10-CM

## 2024-03-28 NOTE — Progress Notes (Signed)
 Vitamin D  a little low-add 1000iu/d otc Eosinophils are very high-not sure if allergies flaring. Reck 1 month cbcd Your cholesterol levels are elevated.  Work on low cholesterol and lower carbs/sugars diet and  get exercise to try to lower your cholesterol.  A1C(3 month average of sugars) is elevated.  This ma be considered PreDiabetes.  Work on diet-decrease sugars and starches and aim for 30 minutes of exercise 5 days/week to prevent progression to diabetes   Does she want to f/u 6 mo w/me for reck of A1C,cholesterol, or wait till physical?

## 2024-04-08 ENCOUNTER — Other Ambulatory Visit (HOSPITAL_BASED_OUTPATIENT_CLINIC_OR_DEPARTMENT_OTHER): Payer: Self-pay | Admitting: Obstetrics & Gynecology

## 2024-04-08 ENCOUNTER — Ambulatory Visit (HOSPITAL_BASED_OUTPATIENT_CLINIC_OR_DEPARTMENT_OTHER)
Admission: RE | Admit: 2024-04-08 | Discharge: 2024-04-08 | Disposition: A | Discharge status: Home / Self Care | Source: Ambulatory Visit | Attending: Obstetrics & Gynecology | Admitting: Obstetrics & Gynecology

## 2024-04-08 DIAGNOSIS — Z1231 Encounter for screening mammogram for malignant neoplasm of breast: Secondary | ICD-10-CM | POA: Diagnosis present

## 2024-04-10 ENCOUNTER — Encounter (HOSPITAL_BASED_OUTPATIENT_CLINIC_OR_DEPARTMENT_OTHER): Admitting: Radiology

## 2024-04-10 DIAGNOSIS — Z1231 Encounter for screening mammogram for malignant neoplasm of breast: Secondary | ICD-10-CM

## 2024-05-01 ENCOUNTER — Ambulatory Visit

## 2024-05-09 ENCOUNTER — Other Ambulatory Visit (INDEPENDENT_AMBULATORY_CARE_PROVIDER_SITE_OTHER)

## 2024-05-09 ENCOUNTER — Ambulatory Visit: Admitting: *Deleted

## 2024-05-09 ENCOUNTER — Ambulatory Visit: Payer: Self-pay | Admitting: Family Medicine

## 2024-05-09 DIAGNOSIS — Z23 Encounter for immunization: Secondary | ICD-10-CM | POA: Diagnosis not present

## 2024-05-09 DIAGNOSIS — R898 Other abnormal findings in specimens from other organs, systems and tissues: Secondary | ICD-10-CM | POA: Diagnosis not present

## 2024-05-09 LAB — CBC WITH DIFFERENTIAL/PLATELET
Basophils Absolute: 0.1 K/uL (ref 0.0–0.1)
Basophils Relative: 2.2 % (ref 0.0–3.0)
Eosinophils Absolute: 0.7 K/uL (ref 0.0–0.7)
Eosinophils Relative: 12 % — ABNORMAL HIGH (ref 0.0–5.0)
HCT: 41.9 % (ref 36.0–46.0)
Hemoglobin: 13.8 g/dL (ref 12.0–15.0)
Lymphocytes Relative: 32.4 % (ref 12.0–46.0)
Lymphs Abs: 1.9 K/uL (ref 0.7–4.0)
MCHC: 32.9 g/dL (ref 30.0–36.0)
MCV: 90.5 fl (ref 78.0–100.0)
Monocytes Absolute: 0.5 K/uL (ref 0.1–1.0)
Monocytes Relative: 8.7 % (ref 3.0–12.0)
Neutro Abs: 2.7 K/uL (ref 1.4–7.7)
Neutrophils Relative %: 44.7 % (ref 43.0–77.0)
Platelets: 299 K/uL (ref 150.0–400.0)
RBC: 4.63 Mil/uL (ref 3.87–5.11)
RDW: 13.6 % (ref 11.5–15.5)
WBC: 6 K/uL (ref 4.0–10.5)

## 2024-05-09 NOTE — Progress Notes (Signed)
Per orders of Dr. Ruthine Dose, injection of 2nd Shingles Vaccine given in left deltoid per patient preference by Jobe Gibbon, CMA. Patient tolerated injection well.

## 2024-05-09 NOTE — Progress Notes (Signed)
 Eos slightly better.  They were similar in 2022.  Repeat in 1 month with B12 levels(son if taking B12, stop), and tryptase.  May be from allergies/asthma

## 2024-05-10 ENCOUNTER — Other Ambulatory Visit: Payer: Self-pay | Admitting: *Deleted

## 2024-05-10 DIAGNOSIS — R898 Other abnormal findings in specimens from other organs, systems and tissues: Secondary | ICD-10-CM

## 2024-05-10 NOTE — Addendum Note (Signed)
 Addended by: WENDOLYN JENKINS HERO on: 05/10/2024 12:04 PM   Modules accepted: Orders

## 2024-06-15 ENCOUNTER — Ambulatory Visit: Payer: Self-pay | Admitting: General Surgery

## 2024-06-15 NOTE — Progress Notes (Signed)
 Sent message, via epic in basket, requesting orders in epic from Careers adviser.

## 2024-06-21 NOTE — Patient Instructions (Signed)
 SURGICAL WAITING ROOM VISITATION  Patients having surgery or a procedure may have no more than 2 support people in the waiting area - these visitors may rotate.    Children under the age of 16 must have an adult with them who is not the patient.  Visitors with respiratory illnesses are discouraged from visiting and should remain at home.  If the patient needs to stay at the hospital during part of their recovery, the visitor guidelines for inpatient rooms apply. Pre-op nurse will coordinate an appropriate time for 1 support person to accompany patient in pre-op.  This support person may not rotate.    Please refer to the [facility] website for the visitor guidelines for Inpatients (after your surgery is over and you are in a regular room).       Your procedure is scheduled on: 06-26-24   Report to [facility] Main Entrance    Report to admitting at      0515  AM   Call this number if you have problems the morning of surgery [phone]   Do not eat food :After Midnight.   After Midnight you may have the following liquids until __0430 ____ AM/  DAY OF SURGERY  then nothing by mouth  Water Non-Citrus Juices (without pulp, NO RED-Apple, White grape, White cranberry) Black Coffee (NO MILK/CREAM OR CREAMERS, sugar ok)  Clear Tea (NO MILK/CREAM OR CREAMERS, sugar ok) regular and decaf                             Plain Jell-O (NO RED)                                           Fruit ices (not with fruit pulp, NO RED)                                     Popsicles (NO RED)                                                               Sports drinks like Gatorade (NO RED)                             If you have questions, please contact your surgeon's office.   FOLLOW  ANY ADDITIONAL PRE OP INSTRUCTIONS YOU RECEIVED FROM YOUR SURGEON'S OFFICE!!!     Oral Hygiene is also important to reduce your risk of infection.                                    Remember - BRUSH YOUR  TEETH THE MORNING OF SURGERY WITH YOUR REGULAR TOOTHPASTE  DENTURES WILL BE REMOVED PRIOR TO SURGERY PLEASE DO NOT APPLY Poly grip OR ADHESIVES!!!   Do NOT smoke after Midnight   Stop all vitamins and herbal supplements 7 days before surgery.   Take these medicines the morning of surgery with A SIP OF  WATER: bring rescue inhaler                                  You may not have any metal on your body including hair pins, jewelry, and body piercing             Do not wear make-up, lotions, powders, perfumes/cologne, or deodorant  Do not wear nail polish including gel and S&S, artificial/acrylic nails, or any other type of covering on natural nails including finger and toenails. If you have artificial nails, gel coating, etc. that needs to be removed by a nail salon please have this removed prior to surgery or surgery may need to be canceled/ delayed if the surgeon/ anesthesia feels like they are unable to be safely monitored.   Do not shave  48 hours prior to surgery.        Do not bring valuables to the hospital. [facility] IS NOT             RESPONSIBLE   FOR VALUABLES.   Contacts, glasses, dentures or bridgework may not be worn into surgery.   Bring small overnight bag day of surgery.   DO NOT BRING YOUR HOME MEDICATIONS TO THE HOSPITAL. PHARMACY WILL DISPENSE MEDICATIONS LISTED ON YOUR MEDICATION LIST TO YOU DURING YOUR ADMISSION IN THE HOSPITAL!    Patients discharged on the day of surgery will not be allowed to drive home.  Someone NEEDS to stay with you for the first 24 hours after anesthesia.   Special Instructions: Bring a copy of your healthcare power of attorney and living will documents the day of surgery if you haven't scanned them before.              Please read over the following fact sheets you were given: IF YOU HAVE QUESTIONS ABOUT YOUR PRE-OP INSTRUCTIONS PLEASE CALL [phone].    If you test positive for Covid or have been in contact with anyone that has  tested positive in the last 10 days please notify you surgeon.    [facility] - Preparing for Surgery Before surgery, you can play an important role.  Because skin is not sterile, your skin needs to be as free of germs as possible.  You can reduce the number of germs on your skin by washing with CHG (chlorahexidine gluconate) soap before surgery.  CHG is an antiseptic cleaner which kills germs and bonds with the skin to continue killing germs even after washing. Please DO NOT use if you have an allergy to CHG or antibacterial soaps.  If your skin becomes reddened/irritated stop using the CHG and inform your nurse when you arrive at Short Stay. Do not shave (including legs and underarms) for at least 48 hours prior to the first CHG shower.  You may shave your face/neck. Please follow these instructions carefully:  1.  Shower with CHG Soap the night before surgery and the  morning of Surgery.  2.  If you choose to wash your hair, wash your hair first as usual with your  normal  shampoo.  3.  After you shampoo, rinse your hair and body thoroughly to remove the  shampoo.                            4.  Use CHG as you would any other liquid soap.  You can apply chg directly  to the skin and wash                       Gently with a scrungie or clean washcloth.  5.  Apply the CHG Soap to your body ONLY FROM THE NECK DOWN.   Do not use on face/ open                           Wound or open sores. Avoid contact with eyes, ears mouth and genitals (private parts).                       Wash face,  Genitals (private parts) with your normal soap.             6.  Wash thoroughly, paying special attention to the area where your surgery  will be performed.  7.  Thoroughly rinse your body with warm water from the neck down.  8.  DO NOT shower/wash with your normal soap after using and rinsing off  the CHG Soap.                9.  Pat yourself dry with a clean towel.            10.  Wear clean pajamas.             11.  Place clean sheets on your bed the night of your first shower and do not  sleep with pets. Day of Surgery : Do not apply any lotions/deodorants the morning of surgery.  Please wear clean clothes to the hospital/surgery center.  FAILURE TO FOLLOW THESE INSTRUCTIONS MAY RESULT IN THE CANCELLATION OF YOUR SURGERY PATIENT SIGNATURE_________________________________  NURSE SIGNATURE__________________________________  ________________________________________________________________________

## 2024-06-22 ENCOUNTER — Encounter (HOSPITAL_COMMUNITY)
Admission: RE | Admit: 2024-06-22 | Discharge: 2024-06-22 | Disposition: A | Discharge status: Home / Self Care | Source: Ambulatory Visit | Attending: General Surgery | Admitting: General Surgery

## 2024-06-22 ENCOUNTER — Other Ambulatory Visit: Payer: Self-pay

## 2024-06-22 ENCOUNTER — Encounter (HOSPITAL_COMMUNITY): Payer: Self-pay

## 2024-06-22 VITALS — BP 102/69 | HR 64 | Temp 98.0°F | Resp 16 | Ht 66.0 in | Wt 154.0 lb

## 2024-06-22 DIAGNOSIS — Z01818 Encounter for other preprocedural examination: Secondary | ICD-10-CM

## 2024-06-22 DIAGNOSIS — Z01812 Encounter for preprocedural laboratory examination: Secondary | ICD-10-CM | POA: Insufficient documentation

## 2024-06-22 LAB — CBC
HCT: 40.2 % (ref 36.0–46.0)
Hemoglobin: 13.1 g/dL (ref 12.0–15.0)
MCH: 29.9 pg (ref 26.0–34.0)
MCHC: 32.6 g/dL (ref 30.0–36.0)
MCV: 91.8 fL (ref 80.0–100.0)
Platelets: 372 K/uL (ref 150–400)
RBC: 4.38 MIL/uL (ref 3.87–5.11)
RDW: 12.3 % (ref 11.5–15.5)
WBC: 6.4 K/uL (ref 4.0–10.5)
nRBC: 0 % (ref 0.0–0.2)

## 2024-06-22 NOTE — Progress Notes (Signed)
 PCP - Dr. Jenkins Carrel  lov 02-24-24 Cardiologist - no  PPM/ICD -  Device Orders -  Rep Notified -   Chest x-ray -  EKG -  Stress Test -  ECHO -  Cardiac Cath -   Sleep Study - n/a CPAP -   Fasting Blood Sugar -  Checks Blood Sugar __n/a___ times a day  Blood Thinner Instructions:n/a Aspirin Instructions:  ERAS Protcol - PRE-SURGERY n/a   COVID vaccine -yes  Activity--Able to climb a flight of stairs with no CP or SOB  Anesthesia review: Asthma  Patient denies shortness of breath, fever, cough and chest pain at PAT appointment   All instructions explained to the patient, with a verbal understanding of the material. Patient agrees to go over the instructions while at home for a better understanding. Patient also instructed to self quarantine after being tested for COVID-19. The opportunity to ask questions was provided.

## 2024-06-26 ENCOUNTER — Other Ambulatory Visit: Payer: Self-pay

## 2024-06-26 ENCOUNTER — Ambulatory Visit (HOSPITAL_COMMUNITY)
Admission: RE | Admit: 2024-06-26 | Discharge: 2024-06-26 | Disposition: A | Discharge status: Home / Self Care | Attending: General Surgery | Admitting: General Surgery

## 2024-06-26 ENCOUNTER — Ambulatory Visit (HOSPITAL_COMMUNITY): Admitting: Certified Registered Nurse Anesthetist

## 2024-06-26 ENCOUNTER — Encounter (HOSPITAL_COMMUNITY)
Admission: RE | Disposition: A | Discharge status: Home / Self Care | Payer: Self-pay | Source: Home / Self Care | Attending: General Surgery

## 2024-06-26 ENCOUNTER — Ambulatory Visit (HOSPITAL_COMMUNITY): Payer: Self-pay | Admitting: Physician Assistant

## 2024-06-26 ENCOUNTER — Encounter (HOSPITAL_COMMUNITY): Payer: Self-pay | Admitting: General Surgery

## 2024-06-26 DIAGNOSIS — D171 Benign lipomatous neoplasm of skin and subcutaneous tissue of trunk: Secondary | ICD-10-CM | POA: Diagnosis present

## 2024-06-26 DIAGNOSIS — R7303 Prediabetes: Secondary | ICD-10-CM | POA: Insufficient documentation

## 2024-06-26 DIAGNOSIS — Z825 Family history of asthma and other chronic lower respiratory diseases: Secondary | ICD-10-CM | POA: Insufficient documentation

## 2024-06-26 DIAGNOSIS — J45909 Unspecified asthma, uncomplicated: Secondary | ICD-10-CM | POA: Diagnosis not present

## 2024-06-26 DIAGNOSIS — Z01818 Encounter for other preprocedural examination: Secondary | ICD-10-CM

## 2024-06-26 LAB — POCT PREGNANCY, URINE: Preg Test, Ur: NEGATIVE

## 2024-06-26 SURGERY — EXCISION MASS, BACK
Anesthesia: General

## 2024-06-26 MED ORDER — FENTANYL CITRATE PF 50 MCG/ML IJ SOSY: 25.0000 ug | PREFILLED_SYRINGE | INTRAMUSCULAR | Status: DC | PRN

## 2024-06-26 MED ORDER — CHLORHEXIDINE GLUCONATE CLOTH 2 % EX PADS
6.0000 | MEDICATED_PAD | Freq: Once | CUTANEOUS | Status: DC
Start: 2024-06-26 — End: 2024-06-26

## 2024-06-26 MED ORDER — OXYCODONE HCL 5 MG PO TABS: 5.0000 mg | ORAL_TABLET | Freq: Once | ORAL | Status: DC | PRN

## 2024-06-26 MED ORDER — MIDAZOLAM HCL 2 MG/2ML IJ SOLN
INTRAMUSCULAR | Status: AC
  Filled 2024-06-26: qty 2

## 2024-06-26 MED ORDER — ACETAMINOPHEN 500 MG PO TABS
1000.0000 mg | ORAL_TABLET | ORAL | Status: AC
  Administered 2024-06-26: 1000 mg via ORAL
  Filled 2024-06-26: qty 2

## 2024-06-26 MED ORDER — LACTATED RINGERS IV SOLN: INTRAVENOUS | Status: DC

## 2024-06-26 MED ORDER — GABAPENTIN 300 MG PO CAPS
300.0000 mg | ORAL_CAPSULE | ORAL | Status: AC
  Administered 2024-06-26: 300 mg via ORAL
  Filled 2024-06-26: qty 1

## 2024-06-26 MED ORDER — SODIUM CHLORIDE 0.9% FLUSH: 3.0000 mL | INTRAVENOUS | Status: DC | PRN

## 2024-06-26 MED ORDER — ACETAMINOPHEN 325 MG PO TABS: 650.0000 mg | ORAL_TABLET | ORAL | Status: DC | PRN

## 2024-06-26 MED ORDER — OXYCODONE HCL 5 MG PO TABS: 5.0000 mg | ORAL_TABLET | ORAL | Status: DC | PRN

## 2024-06-26 MED ORDER — ONDANSETRON HCL 4 MG/2ML IJ SOLN
INTRAMUSCULAR | Status: DC | PRN
  Administered 2024-06-26: 4 mg via INTRAVENOUS

## 2024-06-26 MED ORDER — LIDOCAINE HCL (CARDIAC) PF 100 MG/5ML IV SOSY
PREFILLED_SYRINGE | INTRAVENOUS | Status: DC | PRN
Start: 2024-06-26 — End: 2024-06-26
  Administered 2024-06-26: 100 mg via INTRATRACHEAL

## 2024-06-26 MED ORDER — ONDANSETRON HCL 4 MG/2ML IJ SOLN: 4.0000 mg | Freq: Once | INTRAMUSCULAR | Status: DC | PRN

## 2024-06-26 MED ORDER — DEXAMETHASONE SODIUM PHOSPHATE 4 MG/ML IJ SOLN
INTRAMUSCULAR | Status: DC | PRN
  Administered 2024-06-26: 5 mg via INTRAVENOUS

## 2024-06-26 MED ORDER — SODIUM CHLORIDE 0.9 % IV SOLN: 250.0000 mL | INTRAVENOUS | Status: DC | PRN

## 2024-06-26 MED ORDER — ACETAMINOPHEN 650 MG RE SUPP: 650.0000 mg | RECTAL | Status: DC | PRN

## 2024-06-26 MED ORDER — MIDAZOLAM HCL 5 MG/5ML IJ SOLN
INTRAMUSCULAR | Status: DC | PRN
  Administered 2024-06-26: 2 mg via INTRAVENOUS

## 2024-06-26 MED ORDER — ACETAMINOPHEN 10 MG/ML IV SOLN: 1000.0000 mg | Freq: Once | INTRAVENOUS | Status: DC | PRN

## 2024-06-26 MED ORDER — PROPOFOL 10 MG/ML IV BOLUS
INTRAVENOUS | Status: AC
  Filled 2024-06-26: qty 20

## 2024-06-26 MED ORDER — BUPIVACAINE-EPINEPHRINE 0.25% -1:200000 IJ SOLN
INTRAMUSCULAR | Status: DC | PRN
  Administered 2024-06-26: 30 mL

## 2024-06-26 MED ORDER — MORPHINE SULFATE (PF) 2 MG/ML IV SOLN: 2.0000 mg | INTRAVENOUS | Status: DC | PRN

## 2024-06-26 MED ORDER — OXYCODONE HCL 5 MG PO TABS: 5.0000 mg | ORAL_TABLET | Freq: Three times a day (TID) | ORAL | 0 refills | Status: AC | PRN

## 2024-06-26 MED ORDER — ROCURONIUM BROMIDE 10 MG/ML (PF) SYRINGE
PREFILLED_SYRINGE | INTRAVENOUS | Status: DC | PRN
  Administered 2024-06-26: 60 mg via INTRAVENOUS

## 2024-06-26 MED ORDER — ORAL CARE MOUTH RINSE: 15.0000 mL | Freq: Once | OROMUCOSAL | Status: AC

## 2024-06-26 MED ORDER — ROCURONIUM BROMIDE 10 MG/ML (PF) SYRINGE
PREFILLED_SYRINGE | INTRAVENOUS | Status: AC
  Filled 2024-06-26: qty 10

## 2024-06-26 MED ORDER — FENTANYL CITRATE (PF) 100 MCG/2ML IJ SOLN
INTRAMUSCULAR | Status: AC
  Filled 2024-06-26: qty 2

## 2024-06-26 MED ORDER — PROPOFOL 500 MG/50ML IV EMUL
INTRAVENOUS | Status: DC | PRN
  Administered 2024-06-26: 125 ug/kg/min via INTRAVENOUS

## 2024-06-26 MED ORDER — FENTANYL CITRATE (PF) 100 MCG/2ML IJ SOLN
INTRAMUSCULAR | Status: DC | PRN
  Administered 2024-06-26 (×2): 50 ug via INTRAVENOUS

## 2024-06-26 MED ORDER — BUPIVACAINE-EPINEPHRINE (PF) 0.25% -1:200000 IJ SOLN
INTRAMUSCULAR | Status: AC
  Filled 2024-06-26: qty 30

## 2024-06-26 MED ORDER — OXYCODONE HCL 5 MG/5ML PO SOLN: 5.0000 mg | Freq: Once | ORAL | Status: DC | PRN

## 2024-06-26 MED ORDER — CELECOXIB 200 MG PO CAPS
200.0000 mg | ORAL_CAPSULE | ORAL | Status: AC
  Administered 2024-06-26: 200 mg via ORAL
  Filled 2024-06-26: qty 1

## 2024-06-26 MED ORDER — PROPOFOL 1000 MG/100ML IV EMUL
INTRAVENOUS | Status: AC
  Filled 2024-06-26: qty 100

## 2024-06-26 MED ORDER — SUGAMMADEX SODIUM 200 MG/2ML IV SOLN
INTRAVENOUS | Status: DC | PRN
  Administered 2024-06-26: 200 mg via INTRAVENOUS

## 2024-06-26 MED ORDER — ACETAMINOPHEN 325 MG PO TABS: 650.0000 mg | ORAL_TABLET | Freq: Four times a day (QID) | ORAL | 0 refills | Status: AC

## 2024-06-26 MED ORDER — CEFAZOLIN SODIUM-DEXTROSE 2-4 GM/100ML-% IV SOLN
2.0000 g | INTRAVENOUS | Status: AC
Start: 2024-06-26 — End: 2024-06-26
  Administered 2024-06-26: 2 g via INTRAVENOUS
  Filled 2024-06-26: qty 100

## 2024-06-26 MED ORDER — DEXAMETHASONE SODIUM PHOSPHATE 10 MG/ML IJ SOLN
INTRAMUSCULAR | Status: AC
  Filled 2024-06-26: qty 1

## 2024-06-26 MED ORDER — IBUPROFEN 200 MG PO TABS
600.0000 mg | ORAL_TABLET | Freq: Four times a day (QID) | ORAL | 0 refills | Status: AC
Start: 2024-06-26 — End: 2024-07-02

## 2024-06-26 MED ORDER — ONDANSETRON HCL 4 MG/2ML IJ SOLN
INTRAMUSCULAR | Status: AC
  Filled 2024-06-26: qty 2

## 2024-06-26 MED ORDER — SODIUM CHLORIDE 0.9% FLUSH: 3.0000 mL | Freq: Two times a day (BID) | INTRAVENOUS | Status: DC

## 2024-06-26 MED ORDER — LIDOCAINE HCL (PF) 2 % IJ SOLN
INTRAMUSCULAR | Status: AC
  Filled 2024-06-26: qty 5

## 2024-06-26 MED ORDER — SUGAMMADEX SODIUM 200 MG/2ML IV SOLN
INTRAVENOUS | Status: AC
Start: 2024-06-26 — End: 2024-06-26
  Filled 2024-06-26: qty 2

## 2024-06-26 MED ORDER — PROPOFOL 10 MG/ML IV BOLUS
INTRAVENOUS | Status: DC | PRN
  Administered 2024-06-26: 100 mg via INTRAVENOUS

## 2024-06-26 MED ORDER — LIDOCAINE HCL (PF) 1 % IJ SOLN
INTRAMUSCULAR | Status: AC
  Filled 2024-06-26: qty 30

## 2024-06-26 MED ORDER — CHLORHEXIDINE GLUCONATE 0.12 % MT SOLN
15.0000 mL | Freq: Once | OROMUCOSAL | Status: AC
  Administered 2024-06-26: 15 mL via OROMUCOSAL

## 2024-06-26 MED ORDER — CHLORHEXIDINE GLUCONATE CLOTH 2 % EX PADS: 6.0000 | MEDICATED_PAD | Freq: Once | CUTANEOUS | Status: DC

## 2024-06-26 SURGICAL SUPPLY — 28 items
BAG COUNTER SPONGE SURGICOUNT (BAG) IMPLANT
CHLORAPREP W/TINT 26 (MISCELLANEOUS) ×1 IMPLANT
CNTNR URN SCR LID CUP LEK RST (MISCELLANEOUS) IMPLANT
COVER SURGICAL LIGHT HANDLE (MISCELLANEOUS) ×1 IMPLANT
DERMABOND ADVANCED .7 DNX12 (GAUZE/BANDAGES/DRESSINGS) ×1 IMPLANT
DRAPE LAPAROSCOPIC ABDOMINAL (DRAPES) IMPLANT
DRAPE LAPAROTOMY T 102X78X121 (DRAPES) IMPLANT
DRAPE LAPAROTOMY TRNSV 102X78 (DRAPES) IMPLANT
ELECT REM PT RETURN 15FT ADLT (MISCELLANEOUS) ×1 IMPLANT
GLOVE BIO SURGEON STRL SZ7 (GLOVE) ×1 IMPLANT
GOWN STRL REUS W/ TWL XL LVL3 (GOWN DISPOSABLE) ×2 IMPLANT
KIT BASIN OR (CUSTOM PROCEDURE TRAY) ×1 IMPLANT
KIT TURNOVER KIT A (KITS) ×1 IMPLANT
MARKER SKIN DUAL TIP RULER LAB (MISCELLANEOUS) IMPLANT
NDL HYPO 25X1 1.5 SAFETY (NEEDLE) ×1 IMPLANT
NEEDLE HYPO 25X1 1.5 SAFETY (NEEDLE) ×1 IMPLANT
NS IRRIG 1000ML POUR BTL (IV SOLUTION) ×1 IMPLANT
PACK GENERAL/GYN (CUSTOM PROCEDURE TRAY) ×1 IMPLANT
SPIKE FLUID TRANSFER (MISCELLANEOUS) IMPLANT
SPONGE T-LAP 4X18 ~~LOC~~+RFID (SPONGE) IMPLANT
STAPLER SKIN PROX 35W (STAPLE) IMPLANT
SUT ETHILON 2 0 PS N (SUTURE) IMPLANT
SUT MNCRL AB 4-0 PS2 18 (SUTURE) IMPLANT
SUT SILK 2 0 SH (SUTURE) IMPLANT
SUT VIC AB 2-0 SH 18 (SUTURE) IMPLANT
SUT VIC AB 3-0 SH 18 (SUTURE) IMPLANT
SYR CONTROL 10ML LL (SYRINGE) ×1 IMPLANT
TOWEL OR 17X26 10 PK STRL BLUE (TOWEL DISPOSABLE) ×1 IMPLANT

## 2024-06-26 NOTE — Anesthesia Postprocedure Evaluation (Signed)
 Anesthesia Post Note  Patient: Tanya Vaughn  Procedure(s) Performed: EXCISION MASS, BACK     Patient location during evaluation: PACU Anesthesia Type: General Level of consciousness: awake and alert Pain management: pain level controlled Vital Signs Assessment: post-procedure vital signs reviewed and stable Respiratory status: spontaneous breathing, nonlabored ventilation, respiratory function stable and patient connected to nasal cannula oxygen Cardiovascular status: blood pressure returned to baseline and stable Postop Assessment: no apparent nausea or vomiting Anesthetic complications: no   No notable events documented.  Last Vitals:  Vitals:   06/26/24 0915 06/26/24 0929  BP: (!) 96/58 (!) 116/97  Pulse: (!) 54 (!) 54  Resp: 14 15  Temp: 36.6 C   SpO2: 100% 97%    Last Pain:  Vitals:   06/26/24 0929  TempSrc:   PainSc: 0-No pain                 [name]

## 2024-06-26 NOTE — Anesthesia Procedure Notes (Addendum)
 Procedure Name: Intubation Date/Time: 06/26/2024 7:35 AM  Performed by: Judythe Tanda Aran, CRNAPre-anesthesia Checklist: Patient identified, Emergency Drugs available, Suction available and Patient being monitored Patient Re-evaluated:Patient Re-evaluated prior to induction Oxygen Delivery Method: Circle system utilized Preoxygenation: Pre-oxygenation with 100% oxygen Induction Type: IV induction Ventilation: Mask ventilation without difficulty Laryngoscope Size: 2 and Miller Grade View: Grade I Tube type: Oral Tube size: 7.0 mm Number of attempts: 1 Airway Equipment and Method: Stylet Placement Confirmation: ETT inserted through vocal cords under direct vision, positive ETCO2 and breath sounds checked- equal and bilateral Secured at: 21 cm Tube secured with: Tape Dental Injury: Teeth and Oropharynx as per pre-operative assessment

## 2024-06-26 NOTE — Op Note (Signed)
 06/26/2024  8:32 AM  PATIENT:  Tanya Vaughn  53 y.o. female  Patient Care Team: Wendolyn Jenkins Jansky, MD as PCP - General (Family Medicine)  PRE-OPERATIVE DIAGNOSIS:  Back mass (7cm x 4cm x 2cm)  POST-OPERATIVE DIAGNOSIS:  Same  PROCEDURE:  Excision of back mass (7cm x 4cm x 2cm)  SURGEON:  Cordella RONAL Idler, MD  ASSISTANT: None  ANESTHESIA:   general  COUNTS:  Sponge, needle and instrument counts were reported correct x2 at the conclusion of the operation.  EBL: Minimal  DRAINS: None  SPECIMEN: Back Mass  COMPLICATIONS: None  FINDINGS: Lipomatous mass  DISPOSITION: PACU in satisfactory condition  INDICATION: Tanya Vaughn is a 53 year old female who presented to the office for evaluation of a back mass.  The mass had grown slowly over time and become more symptomatic.  I offered to excise the mass in the operating room.  All of her questions were addressed and written consent was obtained.  DESCRIPTION: The patient was identified in preop holding and taken to the OR where she was placed on the operating room table. SCDs were placed. General endotracheal anesthesia was induced without difficulty. She was placed prone and her back was then prepped and draped in the usual sterile fashion. A surgical timeout was performed indicating the correct patient, procedure, positioning and need for preoperative antibiotics.  The previously marked area near the left upper back was identified. A incision was made overlying the mass using a #15 blade and carried down through the subcutaneous tissue using electrocautery. The mass was encountered and appeared lipomatous. The mass was freed from surrounding tissues using blunt dissection and electrocautery, excised, and then passed off the field to be sent as a specimen. The mass measured 7cm x 4cm x 2cm. The wound was irrigated with sterile saline and inspected for hemostasis. The deep layers were closed with interrupted 2-0 vicryl. Interrupted 3-0  vicryl were used to approximate the dermis followed by a running 4-0 subcuticular monocryl. A field block was performed using 0.25% marcaine with epinephrine. A layer of dermabond was applied.

## 2024-06-26 NOTE — Discharge Instructions (Signed)
 Outpatient Surgery Home Care Instruction  Activity  The effects of anesthesia are still present and drowsiness may result.  Limit activity for the first 24 hours, then you may return to normal daily activities. Returning to normal daily activities as soon as you can following surgery will enhance recovery time.  Do not drive or operate heavy machinery within 24 hours of taking narcotic pain medications.   Do not mow the lawn, use a vacuum cleaner, or do any other strenuous activities without first consulting your surgical team.   Diet Drink plenty of fluids and eat light meals today, then resume regular diet. Some patients may find their appetite is poor for a week or two after surgery. This is a normal result of the stress of surgery-your appetite will return in time.   There are no specific diet restrictions after surgery.   Dressing and Wound Care  Keep your wound or incision site clean and dry.  You may have different types of dressings covering your incisions depending on your operation and your surgeon: Dermabond/Durabond (skin glue): This will usually remain in place for 10-14 days, then naturally fall off your skin. You may take a shower 24 hrs after surgery, carefully wash, not scrub the incision site with a mild non-scented soap. Pat dry with a soft towel.  Do not pick or peel skin glue off.  You can shower and let the water fall on the dressings above. Do not soak or submerge your incision(s) in a bath tub, hot tub, or swimming pool, until your doctor says it is ok to do so or the incision(s) have completely healed, usually about 2-4 weeks.  Do not use creams, powder, salves or balms on your incision(s).   What to Expect After Surgery   Moderate discomfort controlled with medications  Minimal drainage from incision  Feeling fatigue and weak  Constipation after surgery is common. Drink plenty fluids and eat a high fiber diet.   Pain Control: Prescribed Non-Narcotic Pain  Medication  You will be given three prescriptions.  Two of them will be for prescription strength ibuprofen (i.e. Advil) and prescription strength acetaminophen  (i.e. Tylenol ).  The vast majority of patients will just need these two medications.  One prescription will be for a 'rescue' prescription of an oral narcotic (oxycodone ).  You may fill this if needed.  You will alternate taking the ibuprofen (600mg ) every 6 hours and also the acetaminophen  (650mg ) every 6 hours so that you are taking one of those medications every 3 hours.  For example: o 0800 - take ibuprofen 600mg  o 1100 - take acetaminophen  650mg  o 1400 - take ibuprofen 600mg  o 1700 - take acetaminophen  650mg  o Etc.  Continue taking this alternating pattern of ibuprofen and acetaminophen  for 3 days  If you cannot take one or the other of these medications, just take the one you can every 6 hours.  If you are comfortable at night, you don't have to wake up and take a medication.  If you are still uncomfortable after taking either ibuprofen or acetaminophen , try gentle stretching exercise and ice packs (a bag of frozen vegetables works great).  If you are still uncomfortable, you may fill the narcotic prescription of Oxycodone  and take as directed.  Once you have completed these prescriptions, your pain level should be low enough to stop taking medications altogether or just use an over the counter medication (ibuprofen or acetaminophen ) as needed.    Pain Control: Over the Counter Medications to take as  needed  Colace/Docusate: May be prescribed by your surgeon to prevent constipation caused by the combination of narcotics, effects of anesthesia, and decreased ambulation.  Hold for loose stools or diarrhea. Take 100 mg 1-2 times a day starting tonight.   Fiber: High fiber foods, extra liquids (water 9-13 cups/day) can also assist with constipation. Examples of high fiber foods are fruit, bran. Prune juice and water are also good liquids  to drink.  Milk of Magnesia/Miralax:  If constipated despite takeing the over the counter stool softeners, you may take Milk of Magnesia or Miralax as directed on bottle to assist with constipation.     Pepcid /Famotidine : May be prescribed while taking naproxen (Aleve) or other NSAIDs such as ibuprofen (Motrin/Advil) to prevent stomach upset or Acid-reflux symptoms. Take 1 tablet 1-2 times a day.   **Constipation: The first bowel movement may occur anywhere between 1-5 days after surgery.  As long as you are not nauseated or not having significant abdominal pain this variation is acceptable. Narcotic pain medications can cause constipation increasing discomfort; early discontinuation will assist with bowel management. If constipated despite taking stool softeners, you may take Milk of Magnesia or Miralax as directed on the bottle.     **Home medications: You may restart your home medications as directed by your respective Primary Care Physician or Surgeon.   When to notify your Doctor or Healthcare Team   Sign of Wound Infection   Fever over 100 degrees.  Wound becomes extremely swollen, shows red streaks, warm to the touch, and/or drainage from the incision site or foul-smelling drainage.  Wound edges separate or opens up  Bleeding or bruising   If you have bleeding, apply pressure to the site and hold the pressure firmly for 5 minutes. If the bleeding continues, apply pressure again and call 911. If the bleeding stopped, call your doctor to report it.   Call your doctor or nurse if you have increased bleeding from your site and increased bruising or a lump forms or gets larger under your skin at the site. Unrelieved Pain   Call your doctor or nurse if your pain gets worse or is not eased 1 hour after taking your pain medicine, or if it is severe and uncontrolled. Nausea and Vomiting   Call your doctor or nurse if you have nausea and vomiting that continues more than 24 hours, will not let you  keep medicine down and will not let you keep fluids down  Fever, Flu-like symptoms   Fever over 100 degrees and/or chills  Gastrointestinal Bleeding Symptoms    Black tarry bowel movements.  This can be normal after surgery on the stomach, but should resolve in a day or two.    Call 911 if you suddenly have signs of blood loss such as:  Vomiting blood  Fast heart rate  Feeling faint, sweaty, or blacking out  Passing bright red blood from your rectum  Blood Clot Symptoms   Tender, swollen or reddened areas in your calf muscle or thighs.  Numbness or tingling in your lower leg or calf, or at the top of your leg or groin  Skin on your leg looks pale or blue or feels cold to touch  Chest pain or have trouble breathing, lightheadedness, fast heart rate  Sudden Onset of Symptoms    Call 911 if you suddenly have:  Leg weakness and spasm  Loss of bladder or bowel function  Seizure  Confusion, severe headache, dizziness or feeling unsteady, problems talking, difficulty  swallowing, and/or numbness or muscle weakness as these could be signs of a stroke.  Follow up Appointment Your follow up appointment should be scheduled 2-3 weeks after your surgery date.  If you have not previously scheduled for a follow-up visit you can be scheduled by contacting [phone].

## 2024-06-26 NOTE — Anesthesia Preprocedure Evaluation (Addendum)
 Anesthesia Evaluation  Patient identified by MRN, date of birth, ID band Patient awake    Reviewed: Allergy & Precautions, NPO status , Patient's Chart, lab work & pertinent test results, reviewed documented beta blocker date and time   History of Anesthesia Complications Negative for: history of anesthetic complications  Airway Mallampati: II  TM Distance: >3 FB     Dental no notable dental hx.    Pulmonary asthma , neg recent URI   breath sounds clear to auscultation       Cardiovascular (-) angina (-) CAD and (-) Past MI  Rhythm:Regular Rate:Normal     Neuro/Psych neg Seizures    GI/Hepatic   Endo/Other  diabetes (pre)    Renal/GU      Musculoskeletal   Abdominal   Peds  Hematology   Anesthesia Other Findings   Reproductive/Obstetrics                              Anesthesia Physical Anesthesia Plan  ASA: 2  Anesthesia Plan: General   Post-op Pain Management:    Induction: Intravenous  PONV Risk Score and Plan: 2 and Ondansetron, Dexamethasone and Propofol infusion  Airway Management Planned: Oral ETT  Additional Equipment:   Intra-op Plan:   Post-operative Plan: Extubation in OR  Informed Consent: I have reviewed the patients History and Physical, chart, labs and discussed the procedure including the risks, benefits and alternatives for the proposed anesthesia with the patient or authorized representative who has indicated his/her understanding and acceptance.     Dental advisory given  Plan Discussed with: CRNA  Anesthesia Plan Comments:          Anesthesia Quick Evaluation

## 2024-06-26 NOTE — H&P (Signed)
 Tanya Vaughn Dec 31, 1970  [id number].    HPI:  53 y/o F with a back mass who presents for elective excision. She reports that she is in her usual state of health and denies any recent changes in medication.   ROS: Review of Systems  Constitutional: Negative.   HENT: Negative.    Eyes: Negative.   Respiratory: Negative.    Cardiovascular: Negative.   Gastrointestinal: Negative.   Genitourinary: Negative.   Musculoskeletal: Negative.   Skin:        Back mass  Neurological: Negative.   Endo/Heme/Allergies: Negative.   Psychiatric/Behavioral: Negative.      Family History  Problem Relation Age of Onset   Breast cancer Mother 55       mastectomy, chemo/radiation   Cancer Mother 36 - 10       breast,  colon 57   Alzheimer's disease Father    Emphysema Maternal Grandmother    Asthma Paternal Grandmother    Stomach cancer Paternal Grandfather 46   Colon polyps Neg Hx    Colon cancer Neg Hx    Esophageal cancer Neg Hx    Rectal cancer Neg Hx    Heart disease Neg Hx    Diabetes Neg Hx    Hypertension Neg Hx     Past Medical History:  Diagnosis Date   Allergy 1980   Asthma 1980   Colon polyp    Family history of breast cancer    Family history of stomach cancer    paternal grandfather    Past Surgical History:  Procedure Laterality Date   COLONOSCOPY  07/2021   Dr. Legrand   NO PAST SURGERIES     WISDOM TOOTH EXTRACTION  2000    Social History:  reports that she has never smoked. She has never used smokeless tobacco. She reports that she does not currently use alcohol. She reports that she does not use drugs.  Allergies:  Allergies  Allergen Reactions   Other     cats    Medications Prior to Admission  Medication Sig Dispense Refill   albuterol  (VENTOLIN  HFA) 108 (90 Base) MCG/ACT inhaler Inhale 2 puffs into the lungs every 6 (six) hours as needed for wheezing or shortness of breath. 8 g 5   cholecalciferol (VITAMIN D3) 25 MCG (1000 UNIT) tablet  Take 1,000 Units by mouth daily.     Fluticasone -Umeclidin-Vilant (TRELEGY ELLIPTA ) 100-62.5-25 MCG/ACT AEPB Inhale 1 puff into the lungs daily in the afternoon. 90 each 3   montelukast  (SINGULAIR ) 10 MG tablet Take 1 tablet (10 mg total) by mouth at bedtime. 90 tablet 3   tetrahydrozoline 0.05 % ophthalmic solution Place 1 drop into both eyes daily as needed (Dry eyes).      Physical Exam: Blood pressure 94/62, pulse 69, temperature 98.2 F (36.8 C), temperature source Oral, resp. rate 16, height 5' 6 (1.676 m), weight 69.9 kg, last menstrual period 06/08/2024, SpO2 96%. Gen: female, NAD Back: mass marked  Results for orders placed or performed during the hospital encounter of 06/26/24 (from the past 48 hours)  Pregnancy, urine POC     Status: None   Collection Time: 06/26/24  6:11 AM  Result Value Ref Range   Preg Test, Ur NEGATIVE NEGATIVE    Comment:        THE SENSITIVITY OF THIS METHODOLOGY IS >20 mIU/mL.    No results found.  Assessment/Plan 53 y/o F with a back mass  - Will proceed to the OR.  We discussed the alternatives and potential risks of surgery, including but not limited to: bleeding, infection, damage to surrounding structures, wound complications, recurrence, and need for additional procedures. All questions were addressed and consent was obtained.    Cordella DELENA Polly Marlis [facility] Surgery 06/26/2024, 7:00 AM Please see Amion for pager number during day hours 7:00am-4:30pm or 7:00am -11:30am on weekends

## 2024-06-26 NOTE — Transfer of Care (Signed)
 Immediate Anesthesia Transfer of Care Note  Patient: Tanya Vaughn  Procedure(s) Performed: EXCISION MASS, BACK  Patient Location: PACU  Anesthesia Type:General  Level of Consciousness: awake and patient cooperative  Airway & Oxygen Therapy: Patient Spontanous Breathing  Post-op Assessment: Report given to RN and Post -op Vital signs reviewed and stable  Post vital signs: Reviewed and stable  Last Vitals:  Vitals Value Taken Time  BP 98/41 06/26/24 08:45  Temp    Pulse 74 06/26/24 08:47  Resp 22 06/26/24 08:47  SpO2 100 % 06/26/24 08:47  Vitals shown include unfiled device data.  Last Pain:  Vitals:   06/26/24 0608  TempSrc: Oral         Complications: No notable events documented.

## 2024-06-27 ENCOUNTER — Encounter (HOSPITAL_COMMUNITY): Payer: Self-pay | Admitting: General Surgery

## 2024-06-27 LAB — SURGICAL PATHOLOGY

## 2024-08-28 ENCOUNTER — Encounter (HOSPITAL_BASED_OUTPATIENT_CLINIC_OR_DEPARTMENT_OTHER): Payer: Self-pay | Admitting: Obstetrics & Gynecology

## 2025-02-27 ENCOUNTER — Encounter: Admitting: Family Medicine
